# Patient Record
Sex: Female | Born: 1940 | Race: White | Hispanic: No | Smoking: Never smoker
Health system: Southern US, Community
[De-identification: ages and names within clinical notes are randomized; demographics above are authoritative.]

## PROBLEM LIST (undated history)

## (undated) DIAGNOSIS — M542 Cervicalgia: Secondary | ICD-10-CM

## (undated) DIAGNOSIS — M5030 Other cervical disc degeneration, unspecified cervical region: Secondary | ICD-10-CM

## (undated) DIAGNOSIS — R110 Nausea: Secondary | ICD-10-CM

## (undated) DIAGNOSIS — I10 Essential (primary) hypertension: Secondary | ICD-10-CM

## (undated) DIAGNOSIS — G8929 Other chronic pain: Secondary | ICD-10-CM

## (undated) DIAGNOSIS — K219 Gastro-esophageal reflux disease without esophagitis: Secondary | ICD-10-CM

## (undated) DIAGNOSIS — K30 Functional dyspepsia: Secondary | ICD-10-CM

## (undated) DIAGNOSIS — E785 Hyperlipidemia, unspecified: Secondary | ICD-10-CM

## (undated) DIAGNOSIS — M1990 Unspecified osteoarthritis, unspecified site: Secondary | ICD-10-CM

## (undated) DIAGNOSIS — I509 Heart failure, unspecified: Secondary | ICD-10-CM

## (undated) DIAGNOSIS — Z9289 Personal history of other medical treatment: Secondary | ICD-10-CM

## (undated) DIAGNOSIS — R32 Unspecified urinary incontinence: Secondary | ICD-10-CM

## (undated) DIAGNOSIS — M109 Gout, unspecified: Secondary | ICD-10-CM

## (undated) HISTORY — PX: SPINE SURGERY: SHX786

## (undated) HISTORY — PX: BREAST CYST EXCISION: SHX579

## (undated) HISTORY — PX: REPLACEMENT TOTAL KNEE: SUR1224

## (undated) HISTORY — DX: Unspecified urinary incontinence: R32

## (undated) HISTORY — PX: ABDOMINAL HYSTERECTOMY: SHX81

## (undated) HISTORY — DX: Gastro-esophageal reflux disease without esophagitis: K21.9

---

## 2012-04-20 DIAGNOSIS — M545 Low back pain: Secondary | ICD-10-CM | POA: Diagnosis not present

## 2012-04-20 DIAGNOSIS — I10 Essential (primary) hypertension: Secondary | ICD-10-CM | POA: Diagnosis not present

## 2012-04-20 DIAGNOSIS — E559 Vitamin D deficiency, unspecified: Secondary | ICD-10-CM | POA: Diagnosis not present

## 2012-04-20 DIAGNOSIS — Z0000 Encounter for general adult medical examination without abnormal findings: Secondary | ICD-10-CM | POA: Diagnosis not present

## 2012-04-20 DIAGNOSIS — Z23 Encounter for immunization: Secondary | ICD-10-CM | POA: Diagnosis not present

## 2012-04-20 DIAGNOSIS — E785 Hyperlipidemia, unspecified: Secondary | ICD-10-CM | POA: Diagnosis not present

## 2012-04-20 DIAGNOSIS — R279 Unspecified lack of coordination: Secondary | ICD-10-CM | POA: Diagnosis not present

## 2012-05-30 DIAGNOSIS — M1710 Unilateral primary osteoarthritis, unspecified knee: Secondary | ICD-10-CM | POA: Diagnosis not present

## 2012-06-26 DIAGNOSIS — R109 Unspecified abdominal pain: Secondary | ICD-10-CM | POA: Diagnosis not present

## 2012-06-26 DIAGNOSIS — R110 Nausea: Secondary | ICD-10-CM | POA: Diagnosis not present

## 2012-06-26 DIAGNOSIS — K219 Gastro-esophageal reflux disease without esophagitis: Secondary | ICD-10-CM | POA: Diagnosis not present

## 2012-06-26 DIAGNOSIS — I10 Essential (primary) hypertension: Secondary | ICD-10-CM | POA: Diagnosis not present

## 2012-06-26 DIAGNOSIS — Z90710 Acquired absence of both cervix and uterus: Secondary | ICD-10-CM | POA: Diagnosis not present

## 2012-06-26 DIAGNOSIS — N390 Urinary tract infection, site not specified: Secondary | ICD-10-CM | POA: Diagnosis not present

## 2012-07-02 DIAGNOSIS — Z90710 Acquired absence of both cervix and uterus: Secondary | ICD-10-CM | POA: Diagnosis not present

## 2012-07-02 DIAGNOSIS — R110 Nausea: Secondary | ICD-10-CM | POA: Diagnosis not present

## 2012-07-02 DIAGNOSIS — E7800 Pure hypercholesterolemia, unspecified: Secondary | ICD-10-CM | POA: Diagnosis not present

## 2012-07-02 DIAGNOSIS — N390 Urinary tract infection, site not specified: Secondary | ICD-10-CM | POA: Diagnosis not present

## 2012-07-02 DIAGNOSIS — K219 Gastro-esophageal reflux disease without esophagitis: Secondary | ICD-10-CM | POA: Diagnosis not present

## 2012-07-02 DIAGNOSIS — Z862 Personal history of diseases of the blood and blood-forming organs and certain disorders involving the immune mechanism: Secondary | ICD-10-CM | POA: Diagnosis not present

## 2012-07-02 DIAGNOSIS — I10 Essential (primary) hypertension: Secondary | ICD-10-CM | POA: Diagnosis not present

## 2012-07-02 DIAGNOSIS — A048 Other specified bacterial intestinal infections: Secondary | ICD-10-CM | POA: Diagnosis not present

## 2012-07-05 DIAGNOSIS — K2990 Gastroduodenitis, unspecified, without bleeding: Secondary | ICD-10-CM | POA: Diagnosis not present

## 2012-07-05 DIAGNOSIS — R112 Nausea with vomiting, unspecified: Secondary | ICD-10-CM | POA: Diagnosis not present

## 2012-07-05 DIAGNOSIS — K219 Gastro-esophageal reflux disease without esophagitis: Secondary | ICD-10-CM | POA: Diagnosis not present

## 2012-07-05 DIAGNOSIS — E7800 Pure hypercholesterolemia, unspecified: Secondary | ICD-10-CM | POA: Diagnosis not present

## 2012-07-05 DIAGNOSIS — I10 Essential (primary) hypertension: Secondary | ICD-10-CM | POA: Diagnosis not present

## 2012-07-05 DIAGNOSIS — R1013 Epigastric pain: Secondary | ICD-10-CM | POA: Diagnosis not present

## 2012-07-05 DIAGNOSIS — K2960 Other gastritis without bleeding: Secondary | ICD-10-CM | POA: Diagnosis not present

## 2012-07-05 DIAGNOSIS — A048 Other specified bacterial intestinal infections: Secondary | ICD-10-CM | POA: Diagnosis not present

## 2012-07-05 DIAGNOSIS — E860 Dehydration: Secondary | ICD-10-CM | POA: Diagnosis not present

## 2012-07-05 DIAGNOSIS — Z90710 Acquired absence of both cervix and uterus: Secondary | ICD-10-CM | POA: Diagnosis not present

## 2012-07-05 DIAGNOSIS — K2970 Gastritis, unspecified, without bleeding: Secondary | ICD-10-CM | POA: Diagnosis not present

## 2012-07-05 DIAGNOSIS — R109 Unspecified abdominal pain: Secondary | ICD-10-CM | POA: Diagnosis not present

## 2012-07-05 DIAGNOSIS — Z862 Personal history of diseases of the blood and blood-forming organs and certain disorders involving the immune mechanism: Secondary | ICD-10-CM | POA: Diagnosis not present

## 2012-07-05 DIAGNOSIS — I447 Left bundle-branch block, unspecified: Secondary | ICD-10-CM | POA: Diagnosis not present

## 2012-07-05 DIAGNOSIS — R110 Nausea: Secondary | ICD-10-CM | POA: Diagnosis not present

## 2012-07-07 DIAGNOSIS — K2990 Gastroduodenitis, unspecified, without bleeding: Secondary | ICD-10-CM | POA: Diagnosis not present

## 2012-07-07 DIAGNOSIS — K2970 Gastritis, unspecified, without bleeding: Secondary | ICD-10-CM | POA: Diagnosis not present

## 2012-07-07 DIAGNOSIS — K921 Melena: Secondary | ICD-10-CM | POA: Diagnosis not present

## 2012-08-01 DIAGNOSIS — E559 Vitamin D deficiency, unspecified: Secondary | ICD-10-CM | POA: Diagnosis not present

## 2012-08-01 DIAGNOSIS — M25549 Pain in joints of unspecified hand: Secondary | ICD-10-CM | POA: Diagnosis not present

## 2012-08-01 DIAGNOSIS — K2990 Gastroduodenitis, unspecified, without bleeding: Secondary | ICD-10-CM | POA: Diagnosis not present

## 2012-08-01 DIAGNOSIS — M2550 Pain in unspecified joint: Secondary | ICD-10-CM | POA: Diagnosis not present

## 2012-08-01 DIAGNOSIS — I10 Essential (primary) hypertension: Secondary | ICD-10-CM | POA: Diagnosis not present

## 2012-08-01 DIAGNOSIS — E785 Hyperlipidemia, unspecified: Secondary | ICD-10-CM | POA: Diagnosis not present

## 2012-08-01 DIAGNOSIS — K2970 Gastritis, unspecified, without bleeding: Secondary | ICD-10-CM | POA: Diagnosis not present

## 2012-08-27 DIAGNOSIS — M51379 Other intervertebral disc degeneration, lumbosacral region without mention of lumbar back pain or lower extremity pain: Secondary | ICD-10-CM | POA: Diagnosis not present

## 2012-08-27 DIAGNOSIS — M5137 Other intervertebral disc degeneration, lumbosacral region: Secondary | ICD-10-CM | POA: Diagnosis not present

## 2012-08-27 DIAGNOSIS — N390 Urinary tract infection, site not specified: Secondary | ICD-10-CM | POA: Diagnosis not present

## 2012-08-27 DIAGNOSIS — M549 Dorsalgia, unspecified: Secondary | ICD-10-CM | POA: Diagnosis not present

## 2012-08-27 DIAGNOSIS — Z96659 Presence of unspecified artificial knee joint: Secondary | ICD-10-CM | POA: Diagnosis not present

## 2012-09-26 DIAGNOSIS — E2839 Other primary ovarian failure: Secondary | ICD-10-CM | POA: Diagnosis not present

## 2013-06-30 ENCOUNTER — Emergency Department (HOSPITAL_COMMUNITY)
Admission: EM | Admit: 2013-06-30 | Discharge: 2013-06-30 | Disposition: A | Discharge status: Home / Self Care | Payer: Medicare Other | Attending: Emergency Medicine | Admitting: Emergency Medicine

## 2013-06-30 ENCOUNTER — Encounter (HOSPITAL_COMMUNITY): Payer: Self-pay | Admitting: Emergency Medicine

## 2013-06-30 ENCOUNTER — Emergency Department (HOSPITAL_COMMUNITY): Payer: Medicare Other

## 2013-06-30 DIAGNOSIS — R059 Cough, unspecified: Secondary | ICD-10-CM | POA: Diagnosis not present

## 2013-06-30 DIAGNOSIS — R509 Fever, unspecified: Secondary | ICD-10-CM | POA: Diagnosis not present

## 2013-06-30 DIAGNOSIS — Z862 Personal history of diseases of the blood and blood-forming organs and certain disorders involving the immune mechanism: Secondary | ICD-10-CM | POA: Diagnosis not present

## 2013-06-30 DIAGNOSIS — I10 Essential (primary) hypertension: Secondary | ICD-10-CM | POA: Insufficient documentation

## 2013-06-30 DIAGNOSIS — Z8639 Personal history of other endocrine, nutritional and metabolic disease: Secondary | ICD-10-CM | POA: Diagnosis not present

## 2013-06-30 DIAGNOSIS — IMO0001 Reserved for inherently not codable concepts without codable children: Secondary | ICD-10-CM | POA: Insufficient documentation

## 2013-06-30 DIAGNOSIS — R05 Cough: Secondary | ICD-10-CM | POA: Diagnosis not present

## 2013-06-30 DIAGNOSIS — R6889 Other general symptoms and signs: Secondary | ICD-10-CM | POA: Diagnosis not present

## 2013-06-30 DIAGNOSIS — J111 Influenza due to unidentified influenza virus with other respiratory manifestations: Secondary | ICD-10-CM | POA: Diagnosis not present

## 2013-06-30 DIAGNOSIS — E860 Dehydration: Secondary | ICD-10-CM | POA: Diagnosis not present

## 2013-06-30 DIAGNOSIS — J40 Bronchitis, not specified as acute or chronic: Secondary | ICD-10-CM | POA: Diagnosis not present

## 2013-06-30 HISTORY — DX: Gout, unspecified: M10.9

## 2013-06-30 HISTORY — DX: Essential (primary) hypertension: I10

## 2013-06-30 HISTORY — DX: Hyperlipidemia, unspecified: E78.5

## 2013-06-30 LAB — CBC WITH DIFFERENTIAL/PLATELET
BASOS ABS: 0 10*3/uL (ref 0.0–0.1)
BASOS PCT: 0 % (ref 0–1)
Eosinophils Absolute: 0 10*3/uL (ref 0.0–0.7)
Eosinophils Relative: 0 % (ref 0–5)
HCT: 33.9 % — ABNORMAL LOW (ref 36.0–46.0)
Hemoglobin: 11.4 g/dL — ABNORMAL LOW (ref 12.0–15.0)
Lymphocytes Relative: 10 % — ABNORMAL LOW (ref 12–46)
Lymphs Abs: 0.6 10*3/uL — ABNORMAL LOW (ref 0.7–4.0)
MCH: 30.6 pg (ref 26.0–34.0)
MCHC: 33.6 g/dL (ref 30.0–36.0)
MCV: 90.9 fL (ref 78.0–100.0)
Monocytes Absolute: 0.5 10*3/uL (ref 0.1–1.0)
Monocytes Relative: 9 % (ref 3–12)
NEUTROS ABS: 4.5 10*3/uL (ref 1.7–7.7)
NEUTROS PCT: 80 % — AB (ref 43–77)
Platelets: 101 10*3/uL — ABNORMAL LOW (ref 150–400)
RBC: 3.73 MIL/uL — ABNORMAL LOW (ref 3.87–5.11)
RDW: 14.5 % (ref 11.5–15.5)
WBC: 5.6 10*3/uL (ref 4.0–10.5)

## 2013-06-30 LAB — COMPREHENSIVE METABOLIC PANEL
ALBUMIN: 3.3 g/dL — AB (ref 3.5–5.2)
ALK PHOS: 37 U/L — AB (ref 39–117)
ALT: 19 U/L (ref 0–35)
AST: 29 U/L (ref 0–37)
BILIRUBIN TOTAL: 0.4 mg/dL (ref 0.3–1.2)
BUN: 19 mg/dL (ref 6–23)
CHLORIDE: 100 meq/L (ref 96–112)
CO2: 23 mEq/L (ref 19–32)
Calcium: 8.5 mg/dL (ref 8.4–10.5)
Creatinine, Ser: 1.85 mg/dL — ABNORMAL HIGH (ref 0.50–1.10)
GFR calc Af Amer: 30 mL/min — ABNORMAL LOW (ref >90)
GFR calc non Af Amer: 26 mL/min — ABNORMAL LOW (ref >90)
Glucose, Bld: 110 mg/dL — ABNORMAL HIGH (ref 70–99)
POTASSIUM: 3.4 meq/L — AB (ref 3.7–5.3)
SODIUM: 138 meq/L (ref 137–147)
Total Protein: 6.2 g/dL (ref 6.0–8.3)

## 2013-06-30 LAB — INFLUENZA PANEL BY PCR (TYPE A & B)
H1N1 flu by pcr: NOT DETECTED
INFLBPCR: NEGATIVE
Influenza A By PCR: POSITIVE — AB

## 2013-06-30 MED ORDER — SODIUM CHLORIDE 0.9 % IV BOLUS (SEPSIS)
500.0000 mL | Freq: Once | INTRAVENOUS | Status: AC
  Administered 2013-06-30: 500 mL via INTRAVENOUS

## 2013-06-30 MED ORDER — ACETAMINOPHEN 325 MG PO TABS
650.0000 mg | ORAL_TABLET | Freq: Four times a day (QID) | ORAL | Status: DC | PRN
  Administered 2013-06-30: 650 mg via ORAL
  Filled 2013-06-30 (×2): qty 2

## 2013-06-30 NOTE — ED Notes (Signed)
Flu like symptoms times 3 days.  Temp 102.5 per ems temp.
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
## 2013-06-30 NOTE — Discharge Instructions (Signed)
Follow up if not improving.  Plenty of fluids

## 2013-08-23 DIAGNOSIS — R7309 Other abnormal glucose: Secondary | ICD-10-CM | POA: Diagnosis not present

## 2013-08-23 DIAGNOSIS — E785 Hyperlipidemia, unspecified: Secondary | ICD-10-CM | POA: Diagnosis not present

## 2013-08-23 DIAGNOSIS — E559 Vitamin D deficiency, unspecified: Secondary | ICD-10-CM | POA: Diagnosis not present

## 2013-08-23 DIAGNOSIS — I10 Essential (primary) hypertension: Secondary | ICD-10-CM | POA: Diagnosis not present

## 2013-08-23 DIAGNOSIS — M25569 Pain in unspecified knee: Secondary | ICD-10-CM | POA: Diagnosis not present

## 2014-03-07 DIAGNOSIS — Z8249 Family history of ischemic heart disease and other diseases of the circulatory system: Secondary | ICD-10-CM | POA: Diagnosis not present

## 2014-03-07 DIAGNOSIS — Z881 Allergy status to other antibiotic agents status: Secondary | ICD-10-CM | POA: Diagnosis not present

## 2014-03-07 DIAGNOSIS — R079 Chest pain, unspecified: Secondary | ICD-10-CM | POA: Diagnosis not present

## 2014-03-07 DIAGNOSIS — N318 Other neuromuscular dysfunction of bladder: Secondary | ICD-10-CM | POA: Diagnosis not present

## 2014-03-07 DIAGNOSIS — I10 Essential (primary) hypertension: Secondary | ICD-10-CM | POA: Diagnosis not present

## 2014-03-07 DIAGNOSIS — Z79899 Other long term (current) drug therapy: Secondary | ICD-10-CM | POA: Diagnosis not present

## 2014-03-07 DIAGNOSIS — K219 Gastro-esophageal reflux disease without esophagitis: Secondary | ICD-10-CM | POA: Diagnosis not present

## 2014-03-07 DIAGNOSIS — R0789 Other chest pain: Secondary | ICD-10-CM | POA: Diagnosis not present

## 2014-03-07 DIAGNOSIS — R0602 Shortness of breath: Secondary | ICD-10-CM | POA: Diagnosis not present

## 2014-03-07 DIAGNOSIS — E785 Hyperlipidemia, unspecified: Secondary | ICD-10-CM | POA: Diagnosis not present

## 2014-03-08 DIAGNOSIS — R079 Chest pain, unspecified: Secondary | ICD-10-CM | POA: Diagnosis not present

## 2014-03-11 ENCOUNTER — Telehealth: Payer: Self-pay | Admitting: Family Medicine

## 2014-03-11 NOTE — Telephone Encounter (Signed)
mcr and patient wants to transfer from [place]

## 2014-03-11 NOTE — Telephone Encounter (Signed)
appt given for 10/2 with [facility]

## 2014-03-21 DIAGNOSIS — R42 Dizziness and giddiness: Secondary | ICD-10-CM | POA: Diagnosis not present

## 2014-03-21 DIAGNOSIS — N390 Urinary tract infection, site not specified: Secondary | ICD-10-CM | POA: Diagnosis not present

## 2014-03-21 DIAGNOSIS — Z8249 Family history of ischemic heart disease and other diseases of the circulatory system: Secondary | ICD-10-CM | POA: Diagnosis not present

## 2014-03-21 DIAGNOSIS — K219 Gastro-esophageal reflux disease without esophagitis: Secondary | ICD-10-CM | POA: Diagnosis not present

## 2014-03-21 DIAGNOSIS — M436 Torticollis: Secondary | ICD-10-CM | POA: Diagnosis not present

## 2014-03-21 DIAGNOSIS — Z79899 Other long term (current) drug therapy: Secondary | ICD-10-CM | POA: Diagnosis not present

## 2014-03-21 DIAGNOSIS — K30 Functional dyspepsia: Secondary | ICD-10-CM

## 2014-03-21 DIAGNOSIS — Z87891 Personal history of nicotine dependence: Secondary | ICD-10-CM | POA: Diagnosis not present

## 2014-03-21 DIAGNOSIS — R55 Syncope and collapse: Secondary | ICD-10-CM | POA: Diagnosis not present

## 2014-03-21 DIAGNOSIS — E780 Pure hypercholesterolemia: Secondary | ICD-10-CM | POA: Diagnosis not present

## 2014-03-21 DIAGNOSIS — R531 Weakness: Secondary | ICD-10-CM | POA: Diagnosis not present

## 2014-03-21 DIAGNOSIS — I10 Essential (primary) hypertension: Secondary | ICD-10-CM | POA: Diagnosis not present

## 2014-03-21 HISTORY — DX: Functional dyspepsia: K30

## 2014-03-22 ENCOUNTER — Encounter (INDEPENDENT_AMBULATORY_CARE_PROVIDER_SITE_OTHER): Payer: Self-pay

## 2014-03-22 ENCOUNTER — Encounter: Payer: Self-pay | Admitting: Family Medicine

## 2014-03-22 ENCOUNTER — Ambulatory Visit (INDEPENDENT_AMBULATORY_CARE_PROVIDER_SITE_OTHER): Payer: Medicare Other | Admitting: Family Medicine

## 2014-03-22 VITALS — BP 133/66 | HR 63 | Temp 98.1°F | Ht 64.0 in | Wt 151.0 lb

## 2014-03-22 DIAGNOSIS — N184 Chronic kidney disease, stage 4 (severe): Secondary | ICD-10-CM

## 2014-03-22 DIAGNOSIS — R634 Abnormal weight loss: Secondary | ICD-10-CM

## 2014-03-22 DIAGNOSIS — Z23 Encounter for immunization: Secondary | ICD-10-CM | POA: Diagnosis not present

## 2014-03-22 DIAGNOSIS — I10 Essential (primary) hypertension: Secondary | ICD-10-CM

## 2014-03-22 DIAGNOSIS — R1310 Dysphagia, unspecified: Secondary | ICD-10-CM | POA: Diagnosis not present

## 2014-03-22 DIAGNOSIS — Z1211 Encounter for screening for malignant neoplasm of colon: Secondary | ICD-10-CM

## 2014-03-22 NOTE — Progress Notes (Signed)
   Subjective:    Patient ID: Delfin Gant, female    DOB: [birth year], 73 y.o.   MRN: [id number]  HPI  There are no active problems to display for this patient.  Outpatient Encounter Prescriptions as of 03/22/2014  Medication Sig  . allopurinol (ZYLOPRIM) 100 MG tablet Take 100 mg by mouth daily.  Marland Kitchen aspirin EC 81 MG tablet Take 81 mg by mouth daily.  Marland Kitchen esomeprazole (NEXIUM) 40 MG capsule Take 40 mg by mouth daily at 12 noon.  Marland Kitchen lisinopril-hydrochlorothiazide (PRINZIDE,ZESTORETIC) 10-12.5 MG per tablet Take 1 tablet by mouth daily.  . Multiple Vitamin (MULTIVITAMIN WITH MINERALS) TABS tablet Take 1 tablet by mouth daily.  . ondansetron (ZOFRAN) 4 MG tablet Take 1 tablet by mouth every 6 (six) hours as needed.  Marland Kitchen oxybutynin (DITROPAN) 5 MG tablet Take 1 tablet by mouth 2 (two) times daily as needed.  . [DISCONTINUED] atorvastatin (LIPITOR) 10 MG tablet Take 10 mg by mouth daily.  . [DISCONTINUED] atenolol (TENORMIN) 50 MG tablet Take 50 mg by mouth daily.  . [DISCONTINUED] diphenhydramine-acetaminophen (TYLENOL PM) 25-500 MG TABS Take 1 tablet by mouth at bedtime as needed (sleep).  . [DISCONTINUED] doxazosin (CARDURA) 4 MG tablet Take 4 mg by mouth daily.  . [DISCONTINUED] fenofibrate (TRICOR) 145 MG tablet Take 145 mg by mouth daily.  . [DISCONTINUED] hydrochlorothiazide (HYDRODIURIL) 25 MG tablet Take 25 mg by mouth daily.  . [DISCONTINUED] potassium chloride SA (K-DUR,KLOR-CON) 20 MEQ tablet Take 20 mEq by mouth once.  . [DISCONTINUED] promethazine (PHENERGAN) 12.5 MG tablet Take 12.5 mg by mouth daily as needed for nausea or vomiting.  . [DISCONTINUED] tolterodine (DETROL LA) 4 MG 24 hr capsule Take 4 mg by mouth daily.     Review of Systems     Objective:   Physical Exam BP 133/66  Pulse 63  Temp(Src) 98.1 F (36.7 C) (Oral)  Ht 5\' 4"  (1.626 m)  Wt 151 lb (68.493 kg)  BMI 25.91 kg/m2        Assessment & Plan:

## 2014-03-22 NOTE — Progress Notes (Signed)
   Subjective:    Patient ID: Melissa Garrett, female    DOB: 20-Jun-1941, 73 y.o.   MRN: [id number]  HPI  73 year old female. This is her first visit here having relocated from near [facility]. She was recently hospitalized in [place] for chest pain. She had negative stress test and was told her chest pain was related to acid reflux. Since she has been taking Nexium she has not had more chest pain. She does have anorexia and weight loss and has been suggested to take boost to supplement her diet. She went to the emergency room yesterday and her blood pressure medicine was changed from atenolol to lisinopril with hydrochlorothiazide. Blood pressure yesterday was 181 today is 133/66. Her potassium was also slightly low at 3.2. She was given instructions to followup with blood work to check renal function and potassium. As I reviewed her labs from the hospital her estimated GFR is 40    Review of Systems  Constitutional: Positive for unexpected weight change.  HENT: Negative.   Respiratory: Negative.   Cardiovascular: Negative.   Endocrine: Negative.   Genitourinary: Negative.   Neurological: Negative.   Hematological: Negative.   Psychiatric/Behavioral: Negative.        Objective:   Physical Exam  Constitutional: She is oriented to person, place, and time.  Gets about in [facility] (had knee replacement x 2 same knee)  HENT:  Head: Normocephalic.  Eyes: Pupils are equal, round, and reactive to light.  Cardiovascular: Normal rate.   Pulmonary/Chest: Effort normal.  Neurological: She is alert and oriented to person, place, and time. She has normal reflexes.  Psychiatric: She has a normal mood and affect.    BP 133/66  Pulse 63  Temp(Src) 98.1 F (36.7 C) (Oral)  Ht 5\' 4"  (1.626 m)  Wt 151 lb (68.493 kg)  BMI 25.91 kg/m2      Assessment & Plan:  1. Dysphagia  - Ambulatory referral to Gastroenterology  2. Encounter for screening colonoscopy  - Ambulatory referral to  Gastroenterology 1. Dysphagia  - Ambulatory referral to Gastroenterology  2. Encounter for screening colonoscopy  - Ambulatory referral to Gastroenterology  3. Essential hypertension Lisinopril/HCTZ just started  4. Loss of weight Supplementing diet with BOOST  5. Chronic kidney disease, stage 4 (severe) Will repeat test in 3 weeks, suggesting more liquids in meantime  Wardell Honour MD

## 2014-03-26 ENCOUNTER — Telehealth: Payer: Self-pay | Admitting: Family Medicine

## 2014-03-26 DIAGNOSIS — K219 Gastro-esophageal reflux disease without esophagitis: Secondary | ICD-10-CM | POA: Diagnosis not present

## 2014-03-26 DIAGNOSIS — R197 Diarrhea, unspecified: Secondary | ICD-10-CM | POA: Diagnosis not present

## 2014-03-26 DIAGNOSIS — I10 Essential (primary) hypertension: Secondary | ICD-10-CM | POA: Diagnosis not present

## 2014-03-26 DIAGNOSIS — Z8249 Family history of ischemic heart disease and other diseases of the circulatory system: Secondary | ICD-10-CM | POA: Diagnosis not present

## 2014-03-26 DIAGNOSIS — Z79899 Other long term (current) drug therapy: Secondary | ICD-10-CM | POA: Diagnosis not present

## 2014-03-26 DIAGNOSIS — R110 Nausea: Secondary | ICD-10-CM | POA: Diagnosis not present

## 2014-03-26 DIAGNOSIS — E780 Pure hypercholesterolemia: Secondary | ICD-10-CM | POA: Diagnosis not present

## 2014-03-26 NOTE — Telephone Encounter (Signed)
Patient states when she has a bowel movement it is black. Patient advised that usually if it is black that it usually means blood and is advised to go to ER for evaluation.

## 2014-03-27 ENCOUNTER — Emergency Department (HOSPITAL_COMMUNITY): Payer: Medicare Other

## 2014-03-27 ENCOUNTER — Inpatient Hospital Stay (HOSPITAL_COMMUNITY)
Admission: EM | Admit: 2014-03-27 | Discharge: 2014-03-29 | DRG: 392 | Disposition: A | Discharge status: Home / Self Care | Payer: Medicare Other | Attending: Internal Medicine | Admitting: Internal Medicine

## 2014-03-27 ENCOUNTER — Encounter (HOSPITAL_COMMUNITY): Payer: Self-pay | Admitting: Emergency Medicine

## 2014-03-27 DIAGNOSIS — R748 Abnormal levels of other serum enzymes: Secondary | ICD-10-CM | POA: Diagnosis present

## 2014-03-27 DIAGNOSIS — Z7982 Long term (current) use of aspirin: Secondary | ICD-10-CM | POA: Diagnosis not present

## 2014-03-27 DIAGNOSIS — N183 Chronic kidney disease, stage 3 unspecified: Secondary | ICD-10-CM | POA: Diagnosis present

## 2014-03-27 DIAGNOSIS — R112 Nausea with vomiting, unspecified: Secondary | ICD-10-CM | POA: Diagnosis not present

## 2014-03-27 DIAGNOSIS — D131 Benign neoplasm of stomach: Secondary | ICD-10-CM | POA: Diagnosis not present

## 2014-03-27 DIAGNOSIS — I708 Atherosclerosis of other arteries: Secondary | ICD-10-CM | POA: Diagnosis present

## 2014-03-27 DIAGNOSIS — N179 Acute kidney failure, unspecified: Secondary | ICD-10-CM | POA: Diagnosis present

## 2014-03-27 DIAGNOSIS — K5900 Constipation, unspecified: Secondary | ICD-10-CM | POA: Diagnosis present

## 2014-03-27 DIAGNOSIS — K219 Gastro-esophageal reflux disease without esophagitis: Secondary | ICD-10-CM | POA: Diagnosis present

## 2014-03-27 DIAGNOSIS — I129 Hypertensive chronic kidney disease with stage 1 through stage 4 chronic kidney disease, or unspecified chronic kidney disease: Secondary | ICD-10-CM | POA: Diagnosis present

## 2014-03-27 DIAGNOSIS — K317 Polyp of stomach and duodenum: Secondary | ICD-10-CM | POA: Diagnosis present

## 2014-03-27 DIAGNOSIS — R634 Abnormal weight loss: Secondary | ICD-10-CM | POA: Diagnosis not present

## 2014-03-27 DIAGNOSIS — N189 Chronic kidney disease, unspecified: Secondary | ICD-10-CM

## 2014-03-27 DIAGNOSIS — K8020 Calculus of gallbladder without cholecystitis without obstruction: Secondary | ICD-10-CM | POA: Diagnosis not present

## 2014-03-27 DIAGNOSIS — Z90710 Acquired absence of both cervix and uterus: Secondary | ICD-10-CM | POA: Diagnosis not present

## 2014-03-27 DIAGNOSIS — R197 Diarrhea, unspecified: Secondary | ICD-10-CM | POA: Diagnosis present

## 2014-03-27 DIAGNOSIS — Z79899 Other long term (current) drug therapy: Secondary | ICD-10-CM | POA: Diagnosis not present

## 2014-03-27 DIAGNOSIS — D649 Anemia, unspecified: Secondary | ICD-10-CM | POA: Diagnosis present

## 2014-03-27 DIAGNOSIS — E785 Hyperlipidemia, unspecified: Secondary | ICD-10-CM | POA: Diagnosis present

## 2014-03-27 DIAGNOSIS — Z825 Family history of asthma and other chronic lower respiratory diseases: Secondary | ICD-10-CM | POA: Diagnosis not present

## 2014-03-27 DIAGNOSIS — E860 Dehydration: Secondary | ICD-10-CM | POA: Diagnosis present

## 2014-03-27 DIAGNOSIS — R110 Nausea: Secondary | ICD-10-CM

## 2014-03-27 DIAGNOSIS — K3184 Gastroparesis: Principal | ICD-10-CM

## 2014-03-27 DIAGNOSIS — R630 Anorexia: Secondary | ICD-10-CM | POA: Diagnosis not present

## 2014-03-27 DIAGNOSIS — D3001 Benign neoplasm of right kidney: Secondary | ICD-10-CM | POA: Diagnosis not present

## 2014-03-27 DIAGNOSIS — Z8052 Family history of malignant neoplasm of bladder: Secondary | ICD-10-CM

## 2014-03-27 DIAGNOSIS — T370X5A Adverse effect of sulfonamides, initial encounter: Secondary | ICD-10-CM | POA: Diagnosis present

## 2014-03-27 DIAGNOSIS — I10 Essential (primary) hypertension: Secondary | ICD-10-CM | POA: Diagnosis present

## 2014-03-27 DIAGNOSIS — I739 Peripheral vascular disease, unspecified: Secondary | ICD-10-CM | POA: Diagnosis present

## 2014-03-27 DIAGNOSIS — I714 Abdominal aortic aneurysm, without rupture: Secondary | ICD-10-CM | POA: Diagnosis not present

## 2014-03-27 DIAGNOSIS — I771 Stricture of artery: Secondary | ICD-10-CM | POA: Diagnosis present

## 2014-03-27 LAB — URINALYSIS, ROUTINE W REFLEX MICROSCOPIC
BILIRUBIN URINE: NEGATIVE
Glucose, UA: NEGATIVE mg/dL
KETONES UR: NEGATIVE mg/dL
Nitrite: NEGATIVE
Protein, ur: NEGATIVE mg/dL
Specific Gravity, Urine: 1.01 (ref 1.005–1.030)
UROBILINOGEN UA: 0.2 mg/dL (ref 0.0–1.0)
pH: 6 (ref 5.0–8.0)

## 2014-03-27 LAB — CBC WITH DIFFERENTIAL/PLATELET
Basophils Absolute: 0 10*3/uL (ref 0.0–0.1)
Basophils Relative: 0 % (ref 0–1)
Eosinophils Absolute: 0.2 10*3/uL (ref 0.0–0.7)
Eosinophils Relative: 2 % (ref 0–5)
HCT: 32.4 % — ABNORMAL LOW (ref 36.0–46.0)
HEMOGLOBIN: 11.3 g/dL — AB (ref 12.0–15.0)
LYMPHS ABS: 2.8 10*3/uL (ref 0.7–4.0)
Lymphocytes Relative: 31 % (ref 12–46)
MCH: 31.4 pg (ref 26.0–34.0)
MCHC: 34.9 g/dL (ref 30.0–36.0)
MCV: 90 fL (ref 78.0–100.0)
MONOS PCT: 6 % (ref 3–12)
Monocytes Absolute: 0.5 10*3/uL (ref 0.1–1.0)
NEUTROS PCT: 62 % (ref 43–77)
Neutro Abs: 5.6 10*3/uL (ref 1.7–7.7)
Platelets: 218 10*3/uL (ref 150–400)
RBC: 3.6 MIL/uL — ABNORMAL LOW (ref 3.87–5.11)
RDW: 14.2 % (ref 11.5–15.5)
WBC: 9.2 10*3/uL (ref 4.0–10.5)

## 2014-03-27 LAB — COMPREHENSIVE METABOLIC PANEL
ALK PHOS: 36 U/L — AB (ref 39–117)
ALT: 18 U/L (ref 0–35)
ANION GAP: 15 (ref 5–15)
AST: 29 U/L (ref 0–37)
Albumin: 3.6 g/dL (ref 3.5–5.2)
BUN: 25 mg/dL — AB (ref 6–23)
CO2: 21 mEq/L (ref 19–32)
Calcium: 9.5 mg/dL (ref 8.4–10.5)
Chloride: 97 mEq/L (ref 96–112)
Creatinine, Ser: 2.54 mg/dL — ABNORMAL HIGH (ref 0.50–1.10)
GFR, EST AFRICAN AMERICAN: 20 mL/min — AB (ref >90)
GFR, EST NON AFRICAN AMERICAN: 18 mL/min — AB (ref >90)
GLUCOSE: 103 mg/dL — AB (ref 70–99)
Potassium: 4.7 mEq/L (ref 3.7–5.3)
Sodium: 133 mEq/L — ABNORMAL LOW (ref 137–147)
TOTAL PROTEIN: 6.6 g/dL (ref 6.0–8.3)
Total Bilirubin: 0.3 mg/dL (ref 0.3–1.2)

## 2014-03-27 LAB — PROTIME-INR
INR: 1.02 (ref 0.00–1.49)
Prothrombin Time: 13.4 seconds (ref 11.6–15.2)

## 2014-03-27 LAB — APTT: APTT: 28 s (ref 24–37)

## 2014-03-27 LAB — URINE MICROSCOPIC-ADD ON

## 2014-03-27 LAB — LIPASE, BLOOD: Lipase: 60 U/L — ABNORMAL HIGH (ref 11–59)

## 2014-03-27 MED ORDER — SODIUM CHLORIDE 0.9 % IV SOLN
INTRAVENOUS | Status: AC
  Administered 2014-03-28: via INTRAVENOUS

## 2014-03-27 MED ORDER — ACETAMINOPHEN 650 MG RE SUPP: 650.0000 mg | Freq: Four times a day (QID) | RECTAL | Status: DC | PRN

## 2014-03-27 MED ORDER — OXYBUTYNIN CHLORIDE 5 MG PO TABS
5.0000 mg | ORAL_TABLET | Freq: Every day | ORAL | Status: DC
  Administered 2014-03-28 – 2014-03-29 (×2): 5 mg via ORAL
  Filled 2014-03-27 (×2): qty 1

## 2014-03-27 MED ORDER — HYDRALAZINE HCL 20 MG/ML IJ SOLN: 10.0000 mg | Freq: Four times a day (QID) | INTRAMUSCULAR | Status: DC | PRN

## 2014-03-27 MED ORDER — ONDANSETRON HCL 4 MG PO TABS: 4.0000 mg | ORAL_TABLET | Freq: Four times a day (QID) | ORAL | Status: DC | PRN

## 2014-03-27 MED ORDER — ASPIRIN EC 81 MG PO TBEC
81.0000 mg | DELAYED_RELEASE_TABLET | Freq: Every day | ORAL | Status: DC
  Administered 2014-03-28 – 2014-03-29 (×2): 81 mg via ORAL
  Filled 2014-03-27 (×2): qty 1

## 2014-03-27 MED ORDER — PANTOPRAZOLE SODIUM 40 MG IV SOLR
40.0000 mg | Freq: Once | INTRAVENOUS | Status: AC
  Administered 2014-03-27: 40 mg via INTRAVENOUS
  Filled 2014-03-27: qty 40

## 2014-03-27 MED ORDER — SODIUM CHLORIDE 0.9 % IV BOLUS (SEPSIS)
500.0000 mL | Freq: Once | INTRAVENOUS | Status: AC
  Administered 2014-03-27: 500 mL via INTRAVENOUS

## 2014-03-27 MED ORDER — ONDANSETRON HCL 4 MG/2ML IJ SOLN
4.0000 mg | Freq: Four times a day (QID) | INTRAMUSCULAR | Status: DC | PRN
Start: 2014-03-27 — End: 2014-03-29
  Administered 2014-03-28 (×2): 4 mg via INTRAVENOUS
  Filled 2014-03-27 (×2): qty 2

## 2014-03-27 MED ORDER — ALBUTEROL SULFATE (2.5 MG/3ML) 0.083% IN NEBU: 2.5000 mg | INHALATION_SOLUTION | RESPIRATORY_TRACT | Status: DC | PRN

## 2014-03-27 MED ORDER — PANTOPRAZOLE SODIUM 40 MG IV SOLR
40.0000 mg | Freq: Two times a day (BID) | INTRAVENOUS | Status: DC
  Administered 2014-03-28 – 2014-03-29 (×3): 40 mg via INTRAVENOUS
  Filled 2014-03-27 (×3): qty 40

## 2014-03-27 MED ORDER — MORPHINE SULFATE 2 MG/ML IJ SOLN: 0.5000 mg | INTRAMUSCULAR | Status: DC | PRN

## 2014-03-27 MED ORDER — ALLOPURINOL 100 MG PO TABS
100.0000 mg | ORAL_TABLET | Freq: Every day | ORAL | Status: DC
  Administered 2014-03-28 – 2014-03-29 (×2): 100 mg via ORAL
  Filled 2014-03-27 (×2): qty 1

## 2014-03-27 MED ORDER — ACETAMINOPHEN 325 MG PO TABS: 650.0000 mg | ORAL_TABLET | Freq: Four times a day (QID) | ORAL | Status: DC | PRN

## 2014-03-27 MED ORDER — ADULT MULTIVITAMIN W/MINERALS CH
1.0000 | ORAL_TABLET | Freq: Every day | ORAL | Status: DC
  Administered 2014-03-28 – 2014-03-29 (×2): 1 via ORAL
  Filled 2014-03-27 (×2): qty 1

## 2014-03-27 MED ORDER — ONDANSETRON HCL 4 MG/2ML IJ SOLN
4.0000 mg | Freq: Once | INTRAMUSCULAR | Status: AC
  Administered 2014-03-27: 4 mg via INTRAVENOUS
  Filled 2014-03-27: qty 2
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
## 2014-03-27 NOTE — ED Notes (Signed)
Pt states she has been nauseated x 5 month, tonight worse vomiting, with dry heaves. Pt has lost 50 lbs in less than 6 months.
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
## 2014-03-27 NOTE — ED Notes (Signed)
Dr Zammit at bedside. 

## 2014-03-27 NOTE — ED Notes (Signed)
Hospitalist at bedside 

## 2014-03-28 ENCOUNTER — Encounter (HOSPITAL_COMMUNITY)
Admission: EM | Disposition: A | Discharge status: Home / Self Care | Payer: Self-pay | Source: Home / Self Care | Attending: Internal Medicine

## 2014-03-28 ENCOUNTER — Encounter (HOSPITAL_COMMUNITY): Payer: Self-pay | Admitting: *Deleted

## 2014-03-28 ENCOUNTER — Inpatient Hospital Stay (HOSPITAL_COMMUNITY): Payer: Medicare Other

## 2014-03-28 DIAGNOSIS — R110 Nausea: Secondary | ICD-10-CM

## 2014-03-28 DIAGNOSIS — D131 Benign neoplasm of stomach: Secondary | ICD-10-CM

## 2014-03-28 DIAGNOSIS — R634 Abnormal weight loss: Secondary | ICD-10-CM

## 2014-03-28 DIAGNOSIS — I771 Stricture of artery: Secondary | ICD-10-CM

## 2014-03-28 HISTORY — PX: ESOPHAGOGASTRODUODENOSCOPY: SHX5428

## 2014-03-28 LAB — CBC
HEMATOCRIT: 31.7 % — AB (ref 36.0–46.0)
Hemoglobin: 10.8 g/dL — ABNORMAL LOW (ref 12.0–15.0)
MCH: 30.9 pg (ref 26.0–34.0)
MCHC: 34.1 g/dL (ref 30.0–36.0)
MCV: 90.8 fL (ref 78.0–100.0)
PLATELETS: 191 10*3/uL (ref 150–400)
RBC: 3.49 MIL/uL — AB (ref 3.87–5.11)
RDW: 14.2 % (ref 11.5–15.5)
WBC: 7.7 10*3/uL (ref 4.0–10.5)

## 2014-03-28 LAB — COMPREHENSIVE METABOLIC PANEL
ALBUMIN: 3.2 g/dL — AB (ref 3.5–5.2)
ALT: 14 U/L (ref 0–35)
ANION GAP: 10 (ref 5–15)
AST: 24 U/L (ref 0–37)
Alkaline Phosphatase: 33 U/L — ABNORMAL LOW (ref 39–117)
BILIRUBIN TOTAL: 0.3 mg/dL (ref 0.3–1.2)
BUN: 23 mg/dL (ref 6–23)
CALCIUM: 9.1 mg/dL (ref 8.4–10.5)
CO2: 23 mEq/L (ref 19–32)
CREATININE: 2.27 mg/dL — AB (ref 0.50–1.10)
Chloride: 105 mEq/L (ref 96–112)
GFR calc Af Amer: 23 mL/min — ABNORMAL LOW (ref >90)
GFR calc non Af Amer: 20 mL/min — ABNORMAL LOW (ref >90)
Glucose, Bld: 81 mg/dL (ref 70–99)
Potassium: 5 mEq/L (ref 3.7–5.3)
Sodium: 138 mEq/L (ref 137–147)
Total Protein: 6.1 g/dL (ref 6.0–8.3)

## 2014-03-28 LAB — GI PATHOGEN PANEL BY PCR, STOOL
C DIFFICILE TOXIN A/B: NEGATIVE
Campylobacter by PCR: NEGATIVE
Cryptosporidium by PCR: NEGATIVE
E COLI (STEC): NEGATIVE
E coli (ETEC) LT/ST: NEGATIVE
E coli 0157 by PCR: NEGATIVE
G lamblia by PCR: NEGATIVE
Norovirus GI/GII: NEGATIVE
Rotavirus A by PCR: NEGATIVE
Salmonella by PCR: NEGATIVE
Shigella by PCR: NEGATIVE

## 2014-03-28 LAB — LIPASE, BLOOD: Lipase: 57 U/L (ref 11–59)

## 2014-03-28 LAB — LIPID PANEL
CHOL/HDL RATIO: 5.6 ratio
CHOLESTEROL: 196 mg/dL (ref 0–200)
HDL: 35 mg/dL — AB (ref >39)
LDL CALC: 123 mg/dL — AB (ref 0–99)
TRIGLYCERIDES: 191 mg/dL — AB (ref <150)
VLDL: 38 mg/dL (ref 0–40)

## 2014-03-28 LAB — CLOSTRIDIUM DIFFICILE BY PCR: Toxigenic C. Difficile by PCR: NEGATIVE

## 2014-03-28 LAB — TROPONIN I: Troponin I: <0.3 ng/mL (ref <0.30)

## 2014-03-28 SURGERY — EGD (ESOPHAGOGASTRODUODENOSCOPY)
Anesthesia: Moderate Sedation

## 2014-03-28 MED ORDER — SODIUM CHLORIDE 0.9 % IV SOLN
INTRAVENOUS | Status: DC
  Administered 2014-03-28: 14:00:00 via INTRAVENOUS

## 2014-03-28 MED ORDER — LOPERAMIDE HCL 2 MG PO CAPS
2.0000 mg | ORAL_CAPSULE | Freq: Two times a day (BID) | ORAL | Status: DC | PRN
  Administered 2014-03-28: 2 mg via ORAL
  Filled 2014-03-28: qty 1

## 2014-03-28 MED ORDER — MIDAZOLAM HCL 5 MG/5ML IJ SOLN
INTRAMUSCULAR | Status: AC
  Filled 2014-03-28: qty 10

## 2014-03-28 MED ORDER — MEPERIDINE HCL 50 MG/ML IJ SOLN
INTRAMUSCULAR | Status: AC
  Filled 2014-03-28: qty 1

## 2014-03-28 MED ORDER — MIDAZOLAM HCL 5 MG/5ML IJ SOLN
INTRAMUSCULAR | Status: DC | PRN
  Administered 2014-03-28: 1 mg via INTRAVENOUS
  Administered 2014-03-28 (×2): 2 mg via INTRAVENOUS
  Administered 2014-03-28: 1 mg via INTRAVENOUS

## 2014-03-28 MED ORDER — BUTAMBEN-TETRACAINE-BENZOCAINE 2-2-14 % EX AERO
INHALATION_SPRAY | CUTANEOUS | Status: DC | PRN
Start: 2014-03-28 — End: 2014-03-28
  Administered 2014-03-28: 2 via TOPICAL

## 2014-03-28 MED ORDER — MEPERIDINE HCL 50 MG/ML IJ SOLN
INTRAMUSCULAR | Status: DC | PRN
  Administered 2014-03-28 (×2): 25 mg via INTRAVENOUS

## 2014-03-28 NOTE — Progress Notes (Signed)
Received report from Penryn in ENDO. Patient arrived back to room. Patient was drowsy but had no complaints. Daughter was at the bedside. Bed was at the lowest level, call bell was within reach, and phone was on bedside table. Will continue to monitor patient at this time.

## 2014-03-28 NOTE — Progress Notes (Signed)
[facility]                                                                              Patient Demographics  Melissa Garrett, is a 73 y.o. female, DOB - 1941-01-12, JSE:[id number]  Admit date - 03/27/2014   Admitting Physician Bonnielee Haff, MD  Outpatient Primary MD for the patient is MILLER, Lillette Boxer, MD  LOS - 1   Chief Complaint  Patient presents with  . Nausea  . Abdominal Pain      HPI on 03/27/2014 Melissa Garrett is a 73 y.o. female with a past medical history of essential hypertension, GERD, history of H. pylori in the past, history of esophageal stricture, status post agitation about 12-13 years ago, chronic kidney disease, stage III, who has had nausea for the last many months. As a result of, which she has had very poor oral intake. She has lost about 50 pounds in the last 6 months. She has had periods of vomiting as well, but none recently. Few days ago, she went to see her primary care physician and was diagnosed with a UTI and was started on Bactrim. Three days ago she started having loose stools, and must have at least 7-8 loose bowel movements. Denied any blood in the stool. She went to [facility] yesterday with similar complaints and they did a Hemoccult testing, which was negative. She denied any vaginal discharge. No urinary discomfort. Has not ever had a colonoscopy. Denied any dizziness, lightheadedness. About a month ago her medications for hypertension were changed over from atenolol to lisinopril. This was done due to bradycardia. She denied any abdominal pain. No headaches. No shortness of breath or chest pains.   Assessment & Plan   Chronic nausea with weight loss -Gastroenterology has been consulted and appreciated -Continue PPI -Patient has been changed to a clear liquid diet by gastroenterology -Will likely need endoscopy and outpatient colonoscopy -Continue antiemetics as needed  Acute diarrhea -Likely secondary to antibiotics, Bactrim -C.  difficile PCR pending -Abdominal exam is benign -Pending abdominal ultrasound  Acute on chronic kidney disease, stage III -Likely secondary to dehydration from diarrhea versus lisinopril -Continue IV fluids and monitor BMP  Abnormal appearing gallbladder on CT -May also be contributing to her nausea although abdominal exam is benign -Pending abdominal ultrasound  Elevated lipase -Likely secondary to nausea -Will continue to monitor  Peripheral drill disease in the abdomen, incidental finding -Patient does have good femoral pulses, appears to be asymptomatic at this time -Will need to follow up with her PCP and/or vascular physician as an outpatient  Essential hypertension -Due to her acute kidney injury, lisinopril will be held -Will add on medications if necessary -Patient was recently switched to lisinopril due to to bradycardia on a beta blocker.  History of dysphagia with esophageal dilatation -Gastroenterology consulted and appreciated  Code Status: Full   Family Communication: None bedside   Disposition Plan: Admitted   Time Spent in minutes   30  minutes  Procedures  None   Consults   Gastroenterology   DVT Prophylaxis  SCDs   Lab Results  Component Value Date   PLT 191 03/28/2014    Medications  Scheduled  Meds: . allopurinol  100 mg Oral Daily  . aspirin EC  81 mg Oral Daily  . multivitamin with minerals  1 tablet Oral Daily  . oxybutynin  5 mg Oral Daily  . pantoprazole (PROTONIX) IV  40 mg Intravenous Q12H   Continuous Infusions: . sodium chloride 75 mL/hr at 03/28/14 0000   PRN Meds:.acetaminophen, acetaminophen, albuterol, hydrALAZINE, morphine injection, ondansetron (ZOFRAN) IV, ondansetron  Antibiotics    Anti-infectives   None      Subjective:   Melissa Garrett seen and examined today.  Patient states that she would like to try eat. She states she's not had much of an appetite the last few months. She also states she feels that food  gets stuck in her throat were scratches her esophagus as she swallows. Patient does state she's had to have dilatation of her esophagus approximately 12 years ago. Patient denies any further episodes of diarrhea at this time. She does state she had 5 loose bowel movements yesterday. At this time, denies any shortness of breath, chest pain, dizziness, headache.   Objective:   Filed Vitals:   03/27/14 2300 03/27/14 2327 03/28/14 0340 03/28/14 0450  BP: 153/49 154/68  152/45  Pulse: 54 58  61  Temp:  98 F (36.7 C)  98.5 F (36.9 C)  TempSrc:  Oral    Resp:  18  18  Height:  5\' 4"  (1.626 m)    Weight:   68.13 kg (150 lb 3.2 oz)   SpO2: 95% 100%  99%    Wt Readings from Last 3 Encounters:  03/28/14 68.13 kg (150 lb 3.2 oz)  03/22/14 68.493 kg (151 lb)     Intake/Output Summary (Last 24 hours) at 03/28/14 1032 Last data filed at 03/28/14 0912  Gross per 24 hour  Intake    450 ml  Output   1000 ml  Net   -550 ml    Exam  General: Well developed, well nourished, NAD, appears stated age  HEENT: NCAT, PERRLA, EOMI, Anicteic Sclera, mucous membranes moist.   Cardiovascular: S1 S2 auscultated, no rubs, murmurs or gallops. Regular rate and rhythm.  Respiratory: Clear to auscultation bilaterally with equal chest rise  Abdomen: Soft, nontender, nondistended, + bowel sounds  Extremities: warm dry without cyanosis clubbing or edema  Neuro: AAOx3, cranial nerves grossly intact. Strength equal and bilateral in upper/lower ext  Psych: Normal affect and demeanor with intact judgement and insight  Data Review   Micro Results Recent Results (from the past 240 hour(s))  CLOSTRIDIUM DIFFICILE BY PCR     Status: None   Collection Time    03/28/14  5:07 AM      Result Value Ref Range Status   C difficile by pcr NEGATIVE  NEGATIVE Final    Radiology Reports Ct Abdomen Pelvis Wo Contrast  03/27/2014   CLINICAL DATA:  Chronic nausea for 4-5 months. Has lost 50 lb in 6 months, due to  loss of appetite. Initial encounter.  EXAM: CT ABDOMEN AND PELVIS WITHOUT CONTRAST  TECHNIQUE: Multidetector CT imaging of the abdomen and pelvis was performed following the standard protocol without IV contrast.  COMPARISON:  None.  FINDINGS: The visualized lung bases are clear.  A small calcified granuloma is noted in the periphery of the right hepatic lobe. The liver is otherwise unremarkable. A small stone is noted within the gallbladder, with likely a small amount of higher attenuation sludge. The gallbladder is otherwise unremarkable. The pancreas and adrenal glands are unremarkable.  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 Nonspecific perinephric stranding is noted bilaterally. The kidneys are otherwise unremarkable. There is no evidence of hydronephrosis. No renal or ureteral stones are seen.  No free fluid is identified. The small bowel is unremarkable in appearance. The stomach is within normal limits. No acute vascular abnormalities are seen. Diffuse calcification is seen along the abdominal aorta, with likely severe narrowing of the proximal right common iliac artery, and mild to moderate narrowing of the left common iliac artery.  The appendix is normal in caliber and contains trace air, without evidence for appendicitis. The colon is partially filled with air and fluid. The sigmoid colon is mildly redundant. The colon is unremarkable in appearance.  The bladder is relatively decompressed and not well assessed. The patient is status post hysterectomy. No suspicious adnexal masses are seen. No inguinal lymphadenopathy is seen.  No acute osseous abnormalities are identified. Mild diffuse sclerotic change involving the visualized osseous structures may reflect an underlying systemic condition.  IMPRESSION: 1. No acute abnormality seen in the abdomen or pelvis. 2. Diffuse mild osteosclerosis noted, which may reflect a variety of systemic conditions.  Further evaluation is suggested as deemed clinically appropriate. 3. Small stone within the gallbladder, with a likely small amount of sludge. Gallbladder otherwise unremarkable in appearance. 4. 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 5. Diffuse calcification along the abdominal aorta, with likely severe narrowing of the proximal right common iliac artery, and mild to moderate narrowing of the left common iliac artery.   Electronically Signed   By: Garald Balding M.D.   On: 03/27/2014 21:56    CBC  Recent Labs Lab 03/27/14 1945 03/28/14 0617  WBC 9.2 7.7  HGB 11.3* 10.8*  HCT 32.4* 31.7*  PLT 218 191  MCV 90.0 90.8  MCH 31.4 30.9  MCHC 34.9 34.1  RDW 14.2 14.2  LYMPHSABS 2.8  --   MONOABS 0.5  --   EOSABS 0.2  --   BASOSABS 0.0  --     Chemistries   Recent Labs Lab 03/27/14 1945 03/28/14 0617  NA 133* 138  K 4.7 5.0  CL 97 105  CO2 21 23  GLUCOSE 103* 81  BUN 25* 23  CREATININE 2.54* 2.27*  CALCIUM 9.5 9.1  AST 29 24  ALT 18 14  ALKPHOS 36* 33*  BILITOT 0.3 0.3   ------------------------------------------------------------------------------------------------------------------ estimated creatinine clearance is 20.6 ml/min (by C-G formula based on Cr of 2.27). ------------------------------------------------------------------------------------------------------------------ No results found for this basename: HGBA1C,  in the last 72 hours ------------------------------------------------------------------------------------------------------------------  Recent Labs  03/28/14 0617  CHOL 196  HDL 35*  LDLCALC 123*  TRIG 191*  CHOLHDL 5.6   ------------------------------------------------------------------------------------------------------------------ No results found for this basename: TSH, T4TOTAL, FREET3, T3FREE, THYROIDAB,  in the last 72  hours ------------------------------------------------------------------------------------------------------------------ No results found for this basename: VITAMINB12, FOLATE, FERRITIN, TIBC, IRON, RETICCTPCT,  in the last 72 hours  Coagulation profile  Recent Labs Lab 03/27/14 1945  INR 1.02    No results found for this basename: DDIMER,  in the last 72 hours  Cardiac Enzymes  Recent Labs Lab 03/27/14 [place] <0.30   ------------------------------------------------------------------------------------------------------------------ No components found with this basename: POCBNP,     Markiya Keefe D.O. on 03/28/2014 at 10:32 AM  Between 7am to 7pm - Pager - [phone]  After 7pm go to [email or web address] - password [email or web address]  And look for the night coverage person  covering for me after hours  [facility] Office  [phone]
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
## 2014-03-28 NOTE — Op Note (Signed)
[facility] [address] [place], [address]   ENDOSCOPY PROCEDURE REPORT  PATIENT: Melissa Garrett, Melissa Garrett  MR#: [id number] BIRTHDATE: [birth year] , 49  yrs. old GENDER: female ENDOSCOPIST: Hildred Laser, MD REFERRED BY:  Pleas Koch, Md PROCEDURE DATE:  13-Apr-2014 PROCEDURE:  EGD, diagnostic and EGD w/ biopsy ASA CLASS:     Class II INDICATIONS:  nausea and weight loss. MEDICATIONS: Cetacaine spray for oral pharyngeal topical anesthesia, Meperidine (Demerol) 50 mg IV, and Versed 6 mg IV TOPICAL ANESTHETIC: Cetacaine Spray  DESCRIPTION OF PROCEDURE: After the risks benefits and alternatives of the procedure were thoroughly explained, informed consent was obtained.  The EG-[id number] ([id number]) endoscope was introduced through the mouth and advanced to the second portion of the duodenum , Without limitations.  The instrument was slowly withdrawn as the mucosa was fully examined.      ESOPHAGUS: The mucosa of the esophagus appeared normal.   The GE junction was located 40 cm from incisors.  STOMACH: A diminutive smooth polyp was found in the gastric fundus. Multiple biopsies was performed using cold forceps.  Sample sent for histology.   The stomach otherwise appeared normal.  DUODENUM: The duodenal mucosa showed no abnormalities in the bulb and 2nd part of the duodenum.  Cold forcep biopsies were taken in the second portion.  Retroflexed views revealed no abnormalities. The scope was then withdrawn from the patient and the procedure completed.  COMPLICATIONS: There were no immediate complications.  ENDOSCOPIC IMPRESSION: 1.  Diminutive polyp in gastric fundus otherwise normal EGD. 2.   Random biopsies from post bulbar mucosa for routine histology.   RECOMMENDATIONS: 1.  Await biopsy results 2.  Solid phase gastric emptying study.  REPEAT EXAM:  eSignedHildred Laser, MD 04/13/14 5:47 PM    CC:  CPT CODES:     43235@Upper  gastrointestinal endoscopy  including esophagus, stomach, and either the duodenum and/or jejunum as appropriate; diagnostic, with or without collection of specimen(s) by brushing or washing (separate procedure) 43239@Upper  gastrointestinal endoscopy including esophagus, stomach, and either the duodenum and/or jejunum as appropriate; with biopsy, single or multiple ICD CODES:  The ICD and CPT codes recommended by this software are interpretations from the data that the clinical staff has captured with the software.  The verification of the translation of this report to the ICD and CPT codes and modifiers is the sole responsibility of the health care institution and practicing physician where this report was generated.  [place]. will not be held responsible for the validity of the ICD and CPT codes included on this report.  AMA assumes no liability for data contained or not contained herein. CPT is a Designer, television/film set of the [employer].  PATIENT NAME:  Synthia, Fairbank MR#: [id number]

## 2014-03-29 ENCOUNTER — Inpatient Hospital Stay (HOSPITAL_COMMUNITY): Payer: Medicare Other

## 2014-03-29 ENCOUNTER — Encounter (HOSPITAL_COMMUNITY): Payer: Self-pay | Admitting: Internal Medicine

## 2014-03-29 DIAGNOSIS — R634 Abnormal weight loss: Secondary | ICD-10-CM

## 2014-03-29 DIAGNOSIS — K3184 Gastroparesis: Principal | ICD-10-CM

## 2014-03-29 LAB — BASIC METABOLIC PANEL
ANION GAP: 11 (ref 5–15)
BUN: 19 mg/dL (ref 6–23)
CHLORIDE: 106 meq/L (ref 96–112)
CO2: 23 mEq/L (ref 19–32)
Calcium: 9.1 mg/dL (ref 8.4–10.5)
Creatinine, Ser: 1.86 mg/dL — ABNORMAL HIGH (ref 0.50–1.10)
GFR calc non Af Amer: 26 mL/min — ABNORMAL LOW (ref >90)
GFR, EST AFRICAN AMERICAN: 30 mL/min — AB (ref >90)
Glucose, Bld: 82 mg/dL (ref 70–99)
POTASSIUM: 5.1 meq/L (ref 3.7–5.3)
Sodium: 140 mEq/L (ref 137–147)

## 2014-03-29 LAB — CBC
HCT: 31.3 % — ABNORMAL LOW (ref 36.0–46.0)
Hemoglobin: 10.5 g/dL — ABNORMAL LOW (ref 12.0–15.0)
MCH: 30.7 pg (ref 26.0–34.0)
MCHC: 33.5 g/dL (ref 30.0–36.0)
MCV: 91.5 fL (ref 78.0–100.0)
PLATELETS: 188 10*3/uL (ref 150–400)
RBC: 3.42 MIL/uL — ABNORMAL LOW (ref 3.87–5.11)
RDW: 14.3 % (ref 11.5–15.5)
WBC: 6.1 10*3/uL (ref 4.0–10.5)

## 2014-03-29 LAB — LIPASE, BLOOD: LIPASE: 40 U/L (ref 11–59)

## 2014-03-29 MED ORDER — METOCLOPRAMIDE HCL 10 MG PO TABS
5.0000 mg | ORAL_TABLET | Freq: Three times a day (TID) | ORAL | Status: DC
  Administered 2014-03-29: 5 mg via ORAL
  Filled 2014-03-29: qty 1

## 2014-03-29 MED ORDER — HYDRALAZINE HCL 25 MG PO TABS: 25.0000 mg | ORAL_TABLET | Freq: Three times a day (TID) | ORAL | Status: DC

## 2014-03-29 MED ORDER — METOCLOPRAMIDE HCL 5 MG PO TABS: 5.0000 mg | ORAL_TABLET | Freq: Three times a day (TID) | ORAL | Status: DC

## 2014-03-29 MED ORDER — LOPERAMIDE HCL 2 MG PO CAPS: 2.0000 mg | ORAL_CAPSULE | Freq: Two times a day (BID) | ORAL | Status: DC | PRN

## 2014-03-29 MED ORDER — TECHNETIUM TC 99M SULFUR COLLOID
2.0000 | Freq: Once | INTRAVENOUS | Status: AC | PRN
  Administered 2014-03-29: 2 via INTRAVENOUS

## 2014-03-29 NOTE — Discharge Instructions (Signed)
Abdominal Pain °Many things can cause abdominal pain. Usually, abdominal pain is not caused by a disease and will improve without treatment. It can often be observed and treated at home. Your health care provider will do a physical exam and possibly order blood tests and X-rays to help determine the seriousness of your pain. However, in many cases, more time must pass before a clear cause of the pain can be found. Before that point, your health care provider may not know if you need more testing or further treatment. °HOME CARE INSTRUCTIONS  °Monitor your abdominal pain for any changes. The following actions may help to alleviate any discomfort you are experiencing: °· Only take over-the-counter or prescription medicines as directed by your health care provider. °· Do not take laxatives unless directed to do so by your health care provider. °· Try a clear liquid diet (broth, tea, or water) as directed by your health care provider. Slowly move to a bland diet as tolerated. °SEEK MEDICAL CARE IF: °· You have unexplained abdominal pain. °· You have abdominal pain associated with nausea or diarrhea. °· You have pain when you urinate or have a bowel movement. °· You experience abdominal pain that wakes you in the night. °· You have abdominal pain that is worsened or improved by eating food. °· You have abdominal pain that is worsened with eating fatty foods. °· You have a fever. °SEEK IMMEDIATE MEDICAL CARE IF:  °· Your pain does not go away within 2 hours. °· You keep throwing up (vomiting). °· Your pain is felt only in portions of the abdomen, such as the right side or the left lower portion of the abdomen. °· You pass bloody or black tarry stools. °MAKE SURE YOU: °· Understand these instructions.   °· Will watch your condition.   °· Will get help right away if you are not doing well or get worse.   °Document Released: 03/17/2005 Document Revised: 06/12/2013 Document Reviewed: 02/14/2013 °ExitCare® Patient Information  ©2015 [employer]. This information is not intended to replace advice given to you by your health care provider. Make sure you discuss any questions you have with your health care provider. ° °Gastroparesis  °Gastroparesis is also called slowed stomach emptying (delayed gastric emptying). It is a condition in which the stomach takes too long to empty its contents. It often happens in people with diabetes.  °CAUSES  °Gastroparesis happens when nerves to the stomach are damaged or stop working. When the nerves are damaged, the muscles of the stomach and intestines do not work normally. The movement of food is slowed or stopped. High blood glucose (sugar) causes changes in nerves and can damage the blood vessels that carry oxygen and nutrients to the nerves. °RISK FACTORS °· Diabetes. °· Post-viral syndromes. °· Eating disorders (anorexia, bulimia). °· Surgery on the stomach or vagus nerve. °· Gastroesophageal reflux disease (rarely). °· Smooth muscle disorders (amyloidosis, scleroderma). °· Metabolic disorders, including hypothyroidism. °· Parkinson disease. °SYMPTOMS  °· Heartburn. °· Feeling sick to your stomach (nausea). °· Vomiting of undigested food. °· An early feeling of fullness when eating. °· Weight loss. °· Abdominal bloating. °· Erratic blood glucose levels. °· Lack of appetite. °· Gastroesophageal reflux. °· Spasms of the stomach wall. °Complications can include: °· Bacterial overgrowth in stomach. Food stays in the stomach and can ferment and cause bacteria to grow. °· Weight loss due to difficulty digesting and absorbing nutrients. °· Vomiting. °· Obstruction in the stomach. Undigested food can harden and cause nausea and vomiting. °·   Blood glucose fluctuations caused by inconsistent food absorption. °DIAGNOSIS  °The diagnosis of gastroparesis is confirmed through one or more of the following tests: °· Barium X-rays and scans. These tests look at how long it takes for food to move through the  stomach. °· Gastric manometry. This test measures electrical and muscular activity in the stomach. A thin tube is passed down the throat into the stomach. The tube contains a wire that takes measurements of the stomach's electrical and muscular activity as it digests liquids and solid food. °· Endoscopy. This procedure is done with a long, thin tube called an endoscope. It is passed through the mouth and gently down the esophagus into the stomach. This tube helps the caregiver look at the lining of the stomach to check for any abnormalities. °· Ultrasonography. This can rule out gallbladder disease or pancreatitis. This test will outline and define the shape of the gallbladder and pancreas. °TREATMENT  °· Treatments may include: °¨ Exercise. °¨ Medicines to control nausea and vomiting. °¨ Medicines to stimulate stomach muscles. °¨ Changes in what and when you eat. °¨ Having smaller meals more often. °¨ Eating low-fiber forms of high-fiber foods, such as eating cooked vegetables instead of raw vegetables. °¨ Eating low-fat foods. °¨ Consuming liquids, which are easier to digest. °· In severe cases, feeding tubes and intravenous (IV) feeding may be needed. °It is important to note that in most cases, treatment does not cure gastroparesis. It is usually a lasting (chronic) condition. Treatment helps you manage the underlying condition so that you can be as healthy and comfortable as possible. °Other treatments °· A gastric neurostimulator has been developed to assist people with gastroparesis. The battery-operated device is surgically implanted. It emits mild electrical pulses to help improve stomach emptying and to control nausea and vomiting. °· The use of botulinum toxin has been shown to improve stomach emptying by decreasing the prolonged contractions of the muscle between the stomach and the small intestine (pyloric sphincter). The benefits are temporary. °SEEK MEDICAL CARE IF:  °· You have diabetes and you are  having problems keeping your blood glucose in goal range. °· You are having nausea, vomiting, bloating, or early feelings of fullness with eating. °· Your symptoms do not change with a change in diet. °Document Released: 06/07/2005 Document Revised: 10/02/2012 Document Reviewed: 11/14/2008 °ExitCare® Patient Information ©2015 [employer]. This information is not intended to replace advice given to you by your health care provider. Make sure you discuss any questions you have with your health care provider. ° °

## 2014-03-29 NOTE — Progress Notes (Signed)
UR chart review completed.  

## 2014-03-29 NOTE — Progress Notes (Signed)
Patient feels better. No bowel movements today. Gastric emptying study revealed 70% of tracer in the stomach at 2 hours indicator of gastroparesis. GI pathogen panel is negative. Duodenal biopsy is pending.  Assessment; #1. Intractable nausea. Gastric emptying study is abnormal but she could have more than one causes of nausea unless she experiences complete resolution with metoclopramide. #2. Diarrhea of recent onset. C. difficile by PCR and GI pathogen panel are negative and diarrhea has improved. #3. Cholelithiasis. Her symptoms are not suggestive of biliary tract disease and therefore would not recommend cholecystectomy at this time.  Recommendations; As discussed with Dr. Ree Kida a she will go home on metoclopramide 10 mg before each meal. Patient informed of potential side effects and if she has any she can either stop the medication or drop the dose to half. She returns for office visit in 2-3 weeks at which time willl consider colonoscopy.

## 2014-03-29 NOTE — Progress Notes (Signed)
OT Cancellation Note  Patient Details Name: Melissa Garrett MRN: [id number] DOB: 11-29-1940   Cancelled Treatment:    Reason Eval/Treat Not Completed: OT screened, no needs identified, will sign off. Pt is at baseline in all ADL and IADL fudntioning.  She has no concerns about returning to independence upon d/c home and reports having family support when needed.  [name], MS, OTR/L [place] [phone] 03/29/2014, 9:36 AM

## 2014-03-29 NOTE — Progress Notes (Signed)
Pt. Discharged today per Dr. Ree Kida. Pt's IV site D/C'd and WDL. Pt's VSS. Pt. Provided with medication list and discharge instructions. Verbalized understanding. Pt. left floor via WC in stable condition accompanied by NT.

## 2014-03-29 NOTE — Discharge Summary (Signed)
Physician Discharge Summary  Melissa Garrett ZOX:[id number] DOB: 04/21/1941 DOA: 03/27/2014  PCP: Wardell Honour, MD  Admit date: 03/27/2014 Discharge date: 03/29/2014  Time spent: 45 minutes  Recommendations for Outpatient Follow-up:  Patient will be discharged to home.  She is to follow up with her primary care physician within one week of discharge and have repeat BMP at that time. Patient should also follow up with Dr. Laural Golden, gastroenterologist, at the specified time. Patient should also contact Dr. Arnoldo Morale, surgeon, for outpatient appointment.  Patient to continue taking her medications as prescribed. Patient may resume activity as tolerated. Patient should follow a heart healthy diet.  Discharge Diagnoses:  Chronic nausea weight loss Possible gastroparesis Acute diarrhea Acute on chronic kidney disease, stage III Cholelithiasis/Abnormal appearing gallbladder on CT Elevated lipase Peripheral arterial disease in the abdomen, incidental finding Essential hypertension History of dysphagia with esophageal dilatation  Discharge Condition: Stable   Diet recommendation: Heart healthy  Filed Weights   03/27/14 1904 03/28/14 0340  Weight: 68.04 kg (150 lb) 68.13 kg (150 lb 3.2 oz)    History of present illness:  on 03/27/2014  Melissa Garrett is a 73 y.o. female with a past medical history of essential hypertension, GERD, history of H. pylori in the past, history of esophageal stricture, status post agitation about 12-13 years ago, chronic kidney disease, stage III, who has had nausea for the last many months. As a result of, which she has had very poor oral intake. She has lost about 50 pounds in the last 6 months. She has had periods of vomiting as well, but none recently. Few days ago, she went to see her primary care physician and was diagnosed with a UTI and was started on Bactrim. Three days ago she started having loose stools, and must have at least 7-8 loose bowel movements. Denied any  blood in the stool. She went to [facility] yesterday with similar complaints and they did a Hemoccult testing, which was negative. She denied any vaginal discharge. No urinary discomfort. Has not ever had a colonoscopy. Denied any dizziness, lightheadedness. About a month ago her medications for hypertension were changed over from atenolol to lisinopril. This was done due to bradycardia. She denied any abdominal pain. No headaches. No shortness of breath or chest pains.  Hospital Course:  Chronic nausea with weight loss  -Possibly secondary to gastroparesis -Gastroenterology has been consulted and appreciated  -Continue PPI  -EGD: Diminutive polyp in gastric fundus, otherwise normal EGD, biopsies taken -Gastric emptying study: Abnormal gastric emptying. 75% retention at 2 hr. -Will start patient on Reglan -She will need to follow up with Dr. Laural Golden for colonoscopy as well as follow up visit -Continue antiemetics as needed   Possible Gastroparesis -Patient had abnormal gastric emptying study -Started on Reglan  Acute diarrhea  -Likely secondary to antibiotics, Bactrim  -Improved -Abdominal exam is benign  -Stool/GI Pathogen panel negative -Abdominal WJ:[id number] (approximately 2 small stones) and gallbladder sludge.   Acute on chronic kidney disease, stage III  -Improving, Likely secondary to dehydration from diarrhea versus lisinopril  -patient should have a repeat BMP in one week after discharge  Cholelithiasis/ Abnormal appearing gallbladder on CT  -May also be contributing to her nausea although abdominal exam is benign  -Abdominal QM:[id number] (approximately 2 small stones) and gallbladder sludge. -Spoke with Dr. Arnoldo Morale who recommended outpatient follow up  Elevated lipase  -Resolved, Likely secondary to nausea   Peripheral Arterial disease in the abdomen, incidental finding  -Patient does have good  femoral pulses, appears to be asymptomatic at this time    -Will need to follow up with her PCP and/or vascular physician as an outpatient   Essential hypertension  -Due to her acute kidney injury, lisinopril will be held  -Patient was recently switched to lisinopril due to to bradycardia on a beta blocker.  -Will discharge patient with hydralazine  History of dysphagia with esophageal dilatation  -Gastroenterology consulted and appreciated -Stable  Procedures: EGD Gastric Emptying Study Abdominal US  Consultations: Dr. Laural Golden, Gastroenterology Dr. Arnoldo Morale, surgery, via phone  Discharge Exam: Filed Vitals:   03/29/14 0503  BP: 144/46  Pulse: 63  Temp: 98.1 F (36.7 C)  Resp: 20   Exam  General: Well developed, well nourished, NAD, appears stated age  HEENT: NCAT, mucous membranes moist.  Cardiovascular: S1 S2 auscultated, no rubs, murmurs or gallops. Regular rate and rhythm.  Respiratory: Clear to auscultation bilaterally with equal chest rise  Abdomen: Soft, nontender, nondistended, + bowel sounds  Extremities: warm dry without cyanosis clubbing or edema  Neuro: AAOx3, no focal deficits Psych: Normal affect and demeanor with intact judgement and insight  Discharge Instructions      Discharge Instructions   Discharge instructions    Complete by:  As directed   Patient will be discharged to home.  She is to follow up with her primary care physician within one week of discharge and have repeat BMP at that time. Patient should also follow up with Dr. Laural Golden, gastroenterologist, at the specified time. Patient should also contact Dr. Arnoldo Morale, surgeon, for outpatient appointment.  Patient to continue taking her medications as prescribed. Patient may resume activity as tolerated. Patient should follow a heart healthy diet.            Medication List    STOP taking these medications       lisinopril-hydrochlorothiazide 10-12.5 MG per tablet  Commonly known as:  PRINZIDE,ZESTORETIC     sulfamethoxazole-trimethoprim 800-160  MG per tablet  Commonly known as:  BACTRIM DS      TAKE these medications       allopurinol 100 MG tablet  Commonly known as:  ZYLOPRIM  Take 100 mg by mouth daily.     aspirin EC 81 MG tablet  Take 81 mg by mouth daily.     esomeprazole 40 MG capsule  Commonly known as:  NEXIUM  Take 40 mg by mouth daily at 12 noon.     hydrALAZINE 25 MG tablet  Commonly known as:  APRESOLINE  Take 1 tablet (25 mg total) by mouth 3 (three) times daily.     LECITHIN PO  Take 1 tablet by mouth daily.     loperamide 2 MG capsule  Commonly known as:  IMODIUM  Take 1 capsule (2 mg total) by mouth 2 (two) times daily as needed for diarrhea or loose stools.     metoCLOPramide 5 MG tablet  Commonly known as:  REGLAN  Take 1 tablet (5 mg total) by mouth 3 (three) times daily before meals.     multivitamin with minerals Tabs tablet  Take 1 tablet by mouth daily.     ondansetron 4 MG tablet  Commonly known as:  ZOFRAN  Take 1 tablet by mouth every 6 (six) hours as needed for nausea or vomiting.     oxybutynin 5 MG tablet  Commonly known as:  DITROPAN  Take 1 tablet by mouth daily.       Allergies  Allergen Reactions  . Ciprofloxacin Other (  See Comments)    Patient states "feel sorta crazy"   Follow-up Information   Follow up with MILLER, Lillette Boxer, MD. Schedule an appointment as soon as possible for a visit in 1 week. [facility] followup, Repeat BMP)    Specialty:  Family Medicine   Contact information:   [place] [address] [phone]       Follow up with REHMAN,NAJEEB U, MD. Schedule an appointment as soon as possible for a visit in 1 week. [facility] followup)    Specialty:  Gastroenterology   Contact information:   [address] [place] [address] [phone]       Follow up with Jamesetta So, MD. Schedule an appointment as soon as possible for a visit in 2 weeks. [facility] follow, gallbladder stones)    Specialty:  General Surgery   Contact  information:   [address] [place] [address] [phone]        The results of significant diagnostics from this hospitalization (including imaging, microbiology, ancillary and laboratory) are listed below for reference.    Significant Diagnostic Studies: Ct Abdomen Pelvis Wo Contrast  03/27/2014   CLINICAL DATA:  Chronic nausea for 4-5 months. Has lost 50 lb in 6 months, due to loss of appetite. Initial encounter.  EXAM: CT ABDOMEN AND PELVIS WITHOUT CONTRAST  TECHNIQUE: Multidetector CT imaging of the abdomen and pelvis was performed following the standard protocol without IV contrast.  COMPARISON:  None.  FINDINGS: The visualized lung bases are clear.  A small calcified granuloma is noted in the periphery of the right hepatic lobe. The liver is otherwise unremarkable. A small stone is noted within the gallbladder, with likely a small amount of higher attenuation sludge. The gallbladder is otherwise unremarkable. The pancreas and adrenal glands are unremarkable.  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 Nonspecific perinephric stranding is noted bilaterally. The kidneys are otherwise unremarkable. There is no evidence of hydronephrosis. No renal or ureteral stones are seen.  No free fluid is identified. The small bowel is unremarkable in appearance. The stomach is within normal limits. No acute vascular abnormalities are seen. Diffuse calcification is seen along the abdominal aorta, with likely severe narrowing of the proximal right common iliac artery, and mild to moderate narrowing of the left common iliac artery.  The appendix is normal in caliber and contains trace air, without evidence for appendicitis. The colon is partially filled with air and fluid. The sigmoid colon is mildly redundant. The colon is unremarkable in appearance.  The bladder is relatively decompressed and not well assessed. The patient is status post  hysterectomy. No suspicious adnexal masses are seen. No inguinal lymphadenopathy is seen.  No acute osseous abnormalities are identified. Mild diffuse sclerotic change involving the visualized osseous structures may reflect an underlying systemic condition.  IMPRESSION: 1. No acute abnormality seen in the abdomen or pelvis. 2. Diffuse mild osteosclerosis noted, which may reflect a variety of systemic conditions. Further evaluation is suggested as deemed clinically appropriate. 3. Small stone within the gallbladder, with a likely small amount of sludge. Gallbladder otherwise unremarkable in appearance. 4. 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 5. Diffuse calcification along the abdominal aorta, with likely severe narrowing of the proximal right common iliac artery, and mild to moderate narrowing of the left common iliac artery.   Electronically Signed   By: Garald Balding M.D.   On:  03/27/2014 21:56   Nm Gastric Emptying  03/29/2014   CLINICAL DATA:  Nausea and anorexia.  Abnormal EGD.  EXAM: NUCLEAR MEDICINE GASTRIC EMPTYING SCAN  TECHNIQUE: After oral ingestion of radiolabeled meal, sequential abdominal images were obtained for 120 minutes. Residual percentage of activity remaining within the stomach was calculated at 60 and 120 minutes.  RADIOPHARMACEUTICALS:  2 mCi  Technetium 99-m labeled sulfur colloid  COMPARISON:  None.  FINDINGS: Expected location of the stomach in the left upper quadrant. Ingested meal empties the stomach gradually over the course of the study with 90% retention at 60 min and 75% retention at 120 min (normal retention less than 30% at a 120 min).  IMPRESSION: Abnormal gastric emptying.  75% retention at 2 hr.   Electronically Signed   By: Franchot Gallo M.D.   On: 03/29/2014 13:54   US Abdomen Complete  03/28/2014   CLINICAL DATA:  Nausea and vomiting for 6 months  EXAM: ULTRASOUND ABDOMEN COMPLETE  COMPARISON:  CT abdomen and pelvis without contrast  03/27/2014  FINDINGS: Gallbladder: Approximately 2 small gallstones are seen within the gallbladder lumen. There is a small amount of gallbladder sludge. Gallbladder wall thickness is normal. The sonographic Murphy sign is negative.  Common bile duct: Diameter: 6 mm  Liver: No focal lesion identified. Within normal limits in parenchymal echogenicity.  IVC: No abnormality visualized.  Pancreas: Visualized portion unremarkable.  Spleen: Size and appearance within normal limits.  Right Kidney: Length: 10.7 cm. Echogenicity within normal limits. No mass or hydronephrosis visualized.  Left Kidney: Length: 10.8 cm. Echogenicity within normal limits. No mass or hydronephrosis visualized.  Abdominal aorta: Atherosclerotic calcification visualized in the abdominal aorta. No evidence of aneurysm.  Other findings: None.  IMPRESSION: 1. Cholelithiasis (approximately 2 small stones) and gallbladder sludge. Gallbladder wall thickness is normal and the sonographic Murphy sign is negative. 2. Abdominal aorta atherosclerosis.   Electronically Signed   By: Curlene Dolphin M.D.   On: 03/28/2014 18:06    Microbiology: Recent Results (from the past 240 hour(s))  CLOSTRIDIUM DIFFICILE BY PCR     Status: None   Collection Time    03/28/14  5:07 AM      Result Value Ref Range Status   C difficile by pcr NEGATIVE  NEGATIVE Final     Labs: Basic Metabolic Panel:  Recent Labs Lab 03/27/14 1945 03/28/14 0617 03/29/14 0555  NA 133* 138 140  K 4.7 5.0 5.1  CL 97 105 106  CO2 21 23 23   GLUCOSE 103* 81 82  BUN 25* 23 19  CREATININE 2.54* 2.27* 1.86*  CALCIUM 9.5 9.1 9.1   Liver Function Tests:  Recent Labs Lab 03/27/14 1945 03/28/14 0617  AST 29 24  ALT 18 14  ALKPHOS 36* 33*  BILITOT 0.3 0.3  PROT 6.6 6.1  ALBUMIN 3.6 3.2*    Recent Labs Lab 03/27/14 1945 03/28/14 0617 03/29/14 0555  LIPASE 60* 57 40   No results found for this basename: AMMONIA,  in the last 168 hours CBC:  Recent Labs Lab  03/27/14 1945 03/28/14 0617 03/29/14 0555  WBC 9.2 7.7 6.1  NEUTROABS 5.6  --   --   HGB 11.3* 10.8* 10.5*  HCT 32.4* 31.7* 31.3*  MCV 90.0 90.8 91.5  PLT 218 191 188   Cardiac Enzymes:  Recent Labs Lab 03/27/14 1945  TROPONINI <0.30   BNP: BNP (last 3 results) No results found for this basename: PROBNP,  in the last 8760 hours CBG: No  results found for this basename: GLUCAP,  in the last 168 hours     Signed:  Cristal Ford  [facility] 03/29/2014, 3:17 PM

## 2014-03-29 NOTE — Care Management Note (Signed)
    Page 1 of 1   03/29/2014     1:29:03 PM CARE MANAGEMENT NOTE 03/29/2014  Patient:  Melissa Garrett, Melissa Garrett   Account Number:  [id number]  Date Initiated:  03/29/2014  Documentation initiated by:  Theophilus Kinds  Subjective/Objective Assessment:   Pt admitted from home with renal failure. Pt lives with her daughter and will return home at discharge. Pt has a cane and walker and is fairly independent with ADL's.     Action/Plan:   No CM needs noted.   Anticipated DC Date:  03/30/2014   Anticipated DC Plan:  [place]  CM consult      Choice offered to / List presented to:             Status of service:  Completed, signed off Medicare Important Message given?  YES (If response is "NO", the following Medicare IM given date fields will be blank) Date Medicare IM given:  03/29/2014 Medicare IM given by:  Theophilus Kinds Date Additional Medicare IM given:   Additional Medicare IM given by:    Discharge Disposition:  HOME/SELF CARE  Per UR Regulation:    If discussed at Long Length of Stay Meetings, dates discussed:    Comments:  03/29/14 [place], RN BSN CM
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
## 2014-04-01 ENCOUNTER — Telehealth: Payer: Self-pay | Admitting: Family Medicine

## 2014-04-01 NOTE — Telephone Encounter (Signed)
Patient states that she has an appt on 10-23 and she was d/cd from hospital this past Friday and they wanted her to follow up this week. Patient only wants to see Dr Sabra Heck and he has no appts for this week. Offered appt with another provider and patient refused. Will keep her appt for the 23rd and will call tomorrow and Wednesday to check for cancellations with [facility]

## 2014-04-12 ENCOUNTER — Ambulatory Visit (INDEPENDENT_AMBULATORY_CARE_PROVIDER_SITE_OTHER): Payer: Medicare Other | Admitting: Family Medicine

## 2014-04-12 ENCOUNTER — Encounter: Payer: Self-pay | Admitting: Family Medicine

## 2014-04-12 VITALS — BP 143/66 | HR 70 | Temp 97.1°F | Ht 64.0 in | Wt 149.8 lb

## 2014-04-12 DIAGNOSIS — I10 Essential (primary) hypertension: Secondary | ICD-10-CM | POA: Diagnosis not present

## 2014-04-12 DIAGNOSIS — N183 Chronic kidney disease, stage 3 unspecified: Secondary | ICD-10-CM

## 2014-04-12 NOTE — Progress Notes (Signed)
   Subjective:    Patient ID: Melissa Garrett, female    DOB: 15-Feb-1941, 73 y.o.   MRN: [id number]  HPI 73 year old female who is here to followup gastroparesis. She had endoscopy earlier this month and was to found have some gastric outlet obstruction and was given metoclopramide but does not take it because she read the list of side effects to she really is doing pretty well. She also has gallstones it was found on recent ER visit but she is not contemplating surgery unless there are more symptomatic.    Review of Systems  Constitutional: Negative.   HENT: Negative.   Cardiovascular: Negative.   Gastrointestinal: Positive for abdominal distention.  Genitourinary: Negative.   Neurological: Negative.   Psychiatric/Behavioral: Negative.        Objective:   Physical Exam  Constitutional: She is oriented to person, place, and time. She appears well-developed and well-nourished.  Eyes: Conjunctivae and EOM are normal.  Neck: Normal range of motion. Neck supple.  Cardiovascular: Normal rate, regular rhythm and normal heart sounds.   Pulmonary/Chest: Effort normal and breath sounds normal.  Abdominal: Soft. Bowel sounds are normal.  Musculoskeletal: Normal range of motion.  Neurological: She is alert and oriented to person, place, and time. She has normal reflexes.  Skin: Skin is warm and dry.  Psychiatric: She has a normal mood and affect. Her behavior is normal. Thought content normal.   BP 143/66  Pulse 70  Temp(Src) 97.1 F (36.2 C) (Oral)  Ht 5\' 4"  (1.626 m)  Wt 149 lb 12.8 oz (67.949 kg)  BMI 25.70 kg/m2       Assessment & Plan:

## 2014-04-12 NOTE — Progress Notes (Signed)
   Subjective:    Patient ID: Melissa Garrett, female    DOB: 24-Sep-1940, 73 y.o.   MRN: [id number]  HPI    Review of Systems     Objective:   Physical Exam        Assessment & Plan:  1. Essential hypertension   2. CKD (chronic kidney disease) stage 3, GFR 30-59 ml/min 3. N&V  Pt asymptomatic now; will use regaln prn  Wardell Honour MD

## 2014-04-22 ENCOUNTER — Ambulatory Visit (INDEPENDENT_AMBULATORY_CARE_PROVIDER_SITE_OTHER): Payer: Medicare Other | Admitting: Internal Medicine

## 2014-04-25 ENCOUNTER — Telehealth (INDEPENDENT_AMBULATORY_CARE_PROVIDER_SITE_OTHER): Payer: Self-pay | Admitting: *Deleted

## 2014-04-25 ENCOUNTER — Other Ambulatory Visit (INDEPENDENT_AMBULATORY_CARE_PROVIDER_SITE_OTHER): Payer: Self-pay | Admitting: *Deleted

## 2014-04-25 ENCOUNTER — Ambulatory Visit (INDEPENDENT_AMBULATORY_CARE_PROVIDER_SITE_OTHER): Payer: Medicare Other | Admitting: Internal Medicine

## 2014-04-25 ENCOUNTER — Encounter (INDEPENDENT_AMBULATORY_CARE_PROVIDER_SITE_OTHER): Payer: Self-pay | Admitting: Internal Medicine

## 2014-04-25 VITALS — BP 104/50 | HR 60 | Temp 97.6°F | Ht 64.0 in | Wt 149.2 lb

## 2014-04-25 DIAGNOSIS — Z1211 Encounter for screening for malignant neoplasm of colon: Secondary | ICD-10-CM

## 2014-04-25 DIAGNOSIS — R110 Nausea: Secondary | ICD-10-CM

## 2014-04-25 DIAGNOSIS — R197 Diarrhea, unspecified: Secondary | ICD-10-CM

## 2014-04-25 NOTE — Patient Instructions (Signed)
Colonoscopy.  The risks and benefits such as perforation, bleeding, and infection were reviewed with the patient and is agreeable. 

## 2014-04-25 NOTE — Telephone Encounter (Signed)
Patient needs trilyte 
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
## 2014-04-29 MED ORDER — PEG 3350-KCL-NA BICARB-NACL 420 G PO SOLR: 4000.0000 mL | Freq: Once | ORAL | Status: DC

## 2014-05-29 ENCOUNTER — Encounter (HOSPITAL_COMMUNITY): Payer: Self-pay | Admitting: *Deleted

## 2014-05-29 ENCOUNTER — Ambulatory Visit (HOSPITAL_COMMUNITY)
Admission: RE | Admit: 2014-05-29 | Discharge: 2014-05-29 | Disposition: A | Discharge status: Home / Self Care | Payer: Medicare Other | Source: Ambulatory Visit | Attending: Internal Medicine | Admitting: Internal Medicine

## 2014-05-29 ENCOUNTER — Encounter (HOSPITAL_COMMUNITY)
Admission: RE | Disposition: A | Discharge status: Home / Self Care | Payer: Self-pay | Source: Ambulatory Visit | Attending: Internal Medicine

## 2014-05-29 DIAGNOSIS — M109 Gout, unspecified: Secondary | ICD-10-CM | POA: Diagnosis not present

## 2014-05-29 DIAGNOSIS — D128 Benign neoplasm of rectum: Secondary | ICD-10-CM | POA: Diagnosis not present

## 2014-05-29 DIAGNOSIS — K644 Residual hemorrhoidal skin tags: Secondary | ICD-10-CM | POA: Diagnosis not present

## 2014-05-29 DIAGNOSIS — Z1211 Encounter for screening for malignant neoplasm of colon: Secondary | ICD-10-CM

## 2014-05-29 DIAGNOSIS — K6389 Other specified diseases of intestine: Secondary | ICD-10-CM | POA: Diagnosis not present

## 2014-05-29 DIAGNOSIS — K219 Gastro-esophageal reflux disease without esophagitis: Secondary | ICD-10-CM | POA: Insufficient documentation

## 2014-05-29 DIAGNOSIS — K5900 Constipation, unspecified: Secondary | ICD-10-CM | POA: Insufficient documentation

## 2014-05-29 DIAGNOSIS — K635 Polyp of colon: Secondary | ICD-10-CM | POA: Diagnosis not present

## 2014-05-29 DIAGNOSIS — E785 Hyperlipidemia, unspecified: Secondary | ICD-10-CM | POA: Insufficient documentation

## 2014-05-29 DIAGNOSIS — Z79899 Other long term (current) drug therapy: Secondary | ICD-10-CM | POA: Diagnosis not present

## 2014-05-29 DIAGNOSIS — K649 Unspecified hemorrhoids: Secondary | ICD-10-CM | POA: Diagnosis not present

## 2014-05-29 DIAGNOSIS — Z7982 Long term (current) use of aspirin: Secondary | ICD-10-CM | POA: Diagnosis not present

## 2014-05-29 DIAGNOSIS — I10 Essential (primary) hypertension: Secondary | ICD-10-CM | POA: Insufficient documentation

## 2014-05-29 HISTORY — PX: COLONOSCOPY: SHX5424

## 2014-05-29 SURGERY — COLONOSCOPY
Anesthesia: Moderate Sedation

## 2014-05-29 MED ORDER — MEPERIDINE HCL 50 MG/ML IJ SOLN
INTRAMUSCULAR | Status: DC
Start: 2014-05-29 — End: 2014-05-29
  Filled 2014-05-29: qty 1

## 2014-05-29 MED ORDER — MEPERIDINE HCL 50 MG/ML IJ SOLN
INTRAMUSCULAR | Status: DC | PRN
  Administered 2014-05-29: 25 mg via INTRAVENOUS

## 2014-05-29 MED ORDER — MIDAZOLAM HCL 5 MG/5ML IJ SOLN
INTRAMUSCULAR | Status: AC
  Filled 2014-05-29: qty 10

## 2014-05-29 MED ORDER — MIDAZOLAM HCL 5 MG/5ML IJ SOLN
INTRAMUSCULAR | Status: DC | PRN
  Administered 2014-05-29: 2 mg via INTRAVENOUS
  Administered 2014-05-29 (×2): 1 mg via INTRAVENOUS
  Administered 2014-05-29: 2 mg via INTRAVENOUS

## 2014-05-29 MED ORDER — STERILE WATER FOR IRRIGATION IR SOLN
Status: DC | PRN
  Administered 2014-05-29: 11:00:00

## 2014-05-29 MED ORDER — SODIUM CHLORIDE 0.9 % IV SOLN
INTRAVENOUS | Status: DC
  Administered 2014-05-29: 1000 mL via INTRAVENOUS

## 2014-05-29 NOTE — Op Note (Signed)
COLONOSCOPY PROCEDURE REPORT  PATIENT:  Melissa Garrett  MR#:  [id number] Birthdate:  September 29, 1940, 73 y.o., female Endoscopist:  Dr. Rogene Houston, MD Referred By:  Dr. Wardell Honour, MD  Procedure Date: 05/29/2014  Procedure:   Colonoscopy with snare polypectomy.  Indications:  Patient is 73 year old Caucasian female was undergoing average risk screening colonoscopy. She has chronic constipation and dependent on laxatives. This is patient's first exam.  Informed Consent:  The procedure and risks were reviewed with the patient and informed consent was obtained.  Medications:  Demerol 25  mg IV Versed 6mg  IV  Description of procedure:  After a digital rectal exam was performed, that colonoscope was advanced from the anus through the rectum and colon to the area of the cecum, ileocecal valve and appendiceal orifice. The cecum was deeply intubated. These structures were well-seen and photographed for the record. From the level of the cecum and ileocecal valve, the scope was slowly and cautiously withdrawn. The mucosal surfaces were carefully surveyed utilizing scope tip to flexion to facilitate fold flattening as needed. The scope was pulled down into the rectum where a thorough exam including retroflexion was performed.  Findings:   Prep excellent. Redundant colon with diffuse mucosal pigmentation. 10 mm broad-based polyp hot snared from rectum. Small external hemorrhoids and single anal papilla.   Therapeutic/Diagnostic Maneuvers Performed:  See above  Complications:  None  Cecal Withdrawal Time:  9 minutes  Impression:  Examination performed to cecum. Redundant colon with diffuse changes of melanosis coli. 10 mm broad-based polyp hot snared from rectum.  Recommendations:  Standard instructions given. Standard instructions given. I will contact patient with biopsy results and further recommendations.  REHMAN,NAJEEB U  05/29/2014 11:53 AM  CC: Dr. Wardell Honour, MD & Dr. Rayne Du  ref. provider found
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
## 2014-05-29 NOTE — Discharge Instructions (Addendum)
No aspirin or NSAIDs for 1 week. °Resume other medications as before. °High fiber diet. °No driving for 24 hours. °Physician will call with biopsy results. ° °Colonoscopy, Care After °Refer to this sheet in the next few weeks. These instructions provide you with information on caring for yourself after your procedure. Your health care provider may also give you more specific instructions. Your treatment has been planned according to current medical practices, but problems sometimes occur. Call your health care provider if you have any problems or questions after your procedure. °WHAT TO EXPECT AFTER THE PROCEDURE  °After your procedure, it is typical to have the following: °· A small amount of blood in your stool. °· Moderate amounts of gas and mild abdominal cramping or bloating. °HOME CARE INSTRUCTIONS °· Do not drive, operate machinery, or sign important documents for 24 hours. °· You may shower and resume your regular physical activities, but move at a slower pace for the first 24 hours. °· Take frequent rest periods for the first 24 hours. °· Walk around or put a warm pack on your abdomen to help reduce abdominal cramping and bloating. °· Drink enough fluids to keep your urine clear or pale yellow. °· You may resume your normal diet as instructed by your health care provider. Avoid heavy or fried foods that are hard to digest. °· Avoid drinking alcohol for 24 hours or as instructed by your health care provider. °· Only take over-the-counter or prescription medicines as directed by your health care provider. °· If a tissue sample (biopsy) was taken during your procedure: °¨ Do not take aspirin or blood thinners for 7 days, or as instructed by your health care provider. °¨ Do not drink alcohol for 7 days, or as instructed by your health care provider. °¨ Eat soft foods for the first 24 hours. °SEEK MEDICAL CARE IF: °You have persistent spotting of blood in your stool 2-3 days after the procedure. °SEEK IMMEDIATE  MEDICAL CARE IF: °· You have more than a small spotting of blood in your stool. °· You pass large blood clots in your stool. °· Your abdomen is swollen (distended). °· You have nausea or vomiting. °· You have a fever. °· You have increasing abdominal pain that is not relieved with medicine. °Document Released: 01/20/2004 Document Revised: 03/28/2013 Document Reviewed: 02/12/2013 °Exit[employer]® Patient Information ©2015 [employer]. This information is not intended to replace advice given to you by your health care provider. Make sure you discuss any questions you have with your health care provider. ° ° °High-Fiber Diet °Fiber is found in fruits, vegetables, and grains. A high-fiber diet encourages the addition of more whole grains, legumes, fruits, and vegetables in your diet. The recommended amount of fiber for adult males is 38 g per day. For adult females, it is 25 g per day. Pregnant and lactating women should get 28 g of fiber per day. If you have a digestive or bowel problem, ask your caregiver for advice before adding high-fiber foods to your diet. Eat a variety of high-fiber foods instead of only a select few type of foods.  °PURPOSE °· To increase stool bulk. °· To make bowel movements more regular to prevent constipation. °· To lower cholesterol. °· To prevent overeating. °WHEN IS THIS DIET USED? °· It may be used if you have constipation and hemorrhoids. °· It may be used if you have uncomplicated diverticulosis (intestine condition) and irritable bowel syndrome. °· It may be used if you need help with weight management. °·   It may be used if you want to add it to your diet as a protective measure against atherosclerosis, diabetes, and cancer. °SOURCES OF FIBER °· Whole-grain breads and cereals. °· Fruits, such as apples, oranges, bananas, berries, prunes, and pears. °· Vegetables, such as green peas, carrots, sweet potatoes, beets, broccoli, cabbage, spinach, and artichokes. °· Legumes, such split peas,  soy, lentils. °· Almonds. °FIBER CONTENT IN FOODS °Starches and Grains / Dietary Fiber (g) °· Cheerios, 1 cup / 3 g °· Corn Flakes cereal, 1 cup / 0.7 g °· Rice crispy treat cereal, 1¼ cup / 0.3 g °· Instant oatmeal (cooked), ½ cup / 2 g °· Frosted wheat cereal, 1 cup / 5.1 g °· Brown, long-grain rice (cooked), 1 cup / 3.5 g °· White, long-grain rice (cooked), 1 cup / 0.6 g °· Enriched macaroni (cooked), 1 cup / 2.5 g °Legumes / Dietary Fiber (g) °· Baked beans (canned, plain, or vegetarian), ½ cup / 5.2 g °· Kidney beans (canned), ½ cup / 6.8 g °· Pinto beans (cooked), ½ cup / 5.5 g °Breads and Crackers / Dietary Fiber (g) °· Plain or honey graham crackers, 2 squares / 0.7 g °· Saltine crackers, 3 squares / 0.3 g °· Plain, salted pretzels, 10 pieces / 1.8 g °· Whole-wheat bread, 1 slice / 1.9 g °· White bread, 1 slice / 0.7 g °· Raisin bread, 1 slice / 1.2 g °· Plain bagel, 3 oz / 2 g °· Flour tortilla, 1 oz / 0.9 g °· Corn tortilla, 1 small / 1.5 g °· Hamburger or hotdog bun, 1 small / 0.9 g °Fruits / Dietary Fiber (g) °· Apple with skin, 1 medium / 4.4 g °· Sweetened applesauce, ½ cup / 1.5 g °· Banana, ½ medium / 1.5 g °· Grapes, 10 grapes / 0.4 g °· Orange, 1 small / 2.3 g °· Raisin, 1.5 oz / 1.6 g °· Melon, 1 cup / 1.4 g °Vegetables / Dietary Fiber (g) °· Green beans (canned), ½ cup / 1.3 g °· Carrots (cooked), ½ cup / 2.3 g °· Broccoli (cooked), ½ cup / 2.8 g °· Peas (cooked), ½ cup / 4.4 g °· Mashed potatoes, ½ cup / 1.6 g °· Lettuce, 1 cup / 0.5 g °· Corn (canned), ½ cup / 1.6 g °· Tomato, ½ cup / 1.1 g °Document Released: 06/07/2005 Document Revised: 12/07/2011 Document Reviewed: 09/09/2011 °Exit[employer]® Patient Information ©2015 [employer]. This information is not intended to replace advice given to you by your health care provider. Make sure you discuss any questions you have with your health care provider. ° °

## 2014-05-30 ENCOUNTER — Encounter (HOSPITAL_COMMUNITY): Payer: Self-pay | Admitting: Internal Medicine

## 2014-06-11 ENCOUNTER — Telehealth: Payer: Self-pay | Admitting: Family Medicine

## 2014-06-11 NOTE — Telephone Encounter (Signed)
Patient was offered an appointment for tomorrow but chose an appt for Monday.  She was scheduled .

## 2014-06-11 NOTE — Telephone Encounter (Signed)
Last visit with you was on 04-12-14.  Please advise.

## 2014-06-12 ENCOUNTER — Ambulatory Visit (INDEPENDENT_AMBULATORY_CARE_PROVIDER_SITE_OTHER): Payer: Medicare Other | Admitting: Family Medicine

## 2014-06-12 ENCOUNTER — Encounter: Payer: Self-pay | Admitting: Family Medicine

## 2014-06-12 VITALS — BP 126/53 | HR 65 | Temp 96.8°F | Ht 64.0 in | Wt 150.0 lb

## 2014-06-12 DIAGNOSIS — I499 Cardiac arrhythmia, unspecified: Secondary | ICD-10-CM

## 2014-06-12 DIAGNOSIS — R3915 Urgency of urination: Secondary | ICD-10-CM

## 2014-06-12 DIAGNOSIS — N3000 Acute cystitis without hematuria: Secondary | ICD-10-CM

## 2014-06-12 DIAGNOSIS — K5901 Slow transit constipation: Secondary | ICD-10-CM | POA: Diagnosis not present

## 2014-06-12 LAB — POCT URINALYSIS DIPSTICK

## 2014-06-12 LAB — POCT UA - MICROSCOPIC ONLY
CASTS, UR, LPF, POC: NEGATIVE
Crystals, Ur, HPF, POC: NEGATIVE
Mucus, UA: NEGATIVE
Yeast, UA: NEGATIVE

## 2014-06-12 MED ORDER — SULFAMETHOXAZOLE-TRIMETHOPRIM 800-160 MG PO TABS: 1.0000 | ORAL_TABLET | Freq: Two times a day (BID) | ORAL | Status: DC

## 2014-06-12 NOTE — Progress Notes (Signed)
Subjective:    Patient ID: Melissa Garrett, female    DOB: [birth year], 73 y.o.   MRN: [id number]  HPI Patient here today for possible uti. He symptoms started about one week ago. The patient has UTI symptoms of painful voiding, urgency, and frequency. She is allergic to ciprofloxacin. The patient is alert and cooperative and has a history of multiple knee surgeries and replacements that did not work and has trouble ambulating and getting on the table. She was able to get on the table today to be evaluated.       Patient Active Problem List   Diagnosis Date Noted  . CKD (chronic kidney disease) stage 3, GFR 30-59 ml/min 03/27/2014  . Acute on chronic renal failure 03/27/2014  . Nausea and vomiting 03/27/2014  . Acute diarrhea 03/27/2014  . Right iliac artery stenosis 03/27/2014  . Nausea & vomiting 03/27/2014  . Essential hypertension 03/22/2014  . Loss of weight 03/22/2014   Outpatient Encounter Prescriptions as of 06/12/2014  Medication Sig  . allopurinol (ZYLOPRIM) 100 MG tablet Take 100 mg by mouth daily.  Marland Kitchen esomeprazole (NEXIUM) 40 MG capsule Take 40 mg by mouth daily at 12 noon.  . hydrALAZINE (APRESOLINE) 25 MG tablet Take 1 tablet by mouth 3 (three) times daily as needed.  Marland Kitchen LECITHIN PO Take 1 tablet by mouth daily.   Marland Kitchen lisinopril-hydrochlorothiazide (PRINZIDE,ZESTORETIC) 10-12.5 MG per tablet Take 1 tablet by mouth daily.  Marland Kitchen loperamide (IMODIUM) 2 MG capsule Take 1 capsule by mouth daily.  . metoCLOPramide (REGLAN) 5 MG tablet Take 1 tablet (5 mg total) by mouth 3 (three) times daily before meals.  . Multiple Vitamin (MULTIVITAMIN WITH MINERALS) TABS tablet Take 1 tablet by mouth daily.  . ondansetron (ZOFRAN) 4 MG tablet Take 1 tablet by mouth every 6 (six) hours as needed for nausea or vomiting.   Marland Kitchen oxybutynin (DITROPAN) 5 MG tablet Take 1 tablet by mouth daily.   . polyethylene glycol-electrolytes (NULYTELY/GOLYTELY) 420 G solution Take 4,000 mLs by mouth once.  .  [DISCONTINUED] lisinopril (PRINIVIL,ZESTRIL) 10 MG tablet Take 10 mg by mouth daily.    Review of Systems  Constitutional: Negative.   HENT: Negative.   Eyes: Negative.   Respiratory: Negative.   Cardiovascular: Negative.   Gastrointestinal: Negative.   Endocrine: Negative.   Genitourinary: Positive for dysuria (slight), urgency and frequency.  Musculoskeletal: Negative.   Skin: Negative.   Allergic/Immunologic: Negative.   Neurological: Negative.   Hematological: Negative.   Psychiatric/Behavioral: Negative.        Objective:   Physical Exam  Constitutional: She appears well-nourished. No distress.  Elderly and alert in a wheelchair with a knee brace on the right knee.  HENT:  Head: Normocephalic.  Eyes: Conjunctivae and EOM are normal. Pupils are equal, round, and reactive to light. Right eye exhibits no discharge.  Neck: Normal range of motion.  Cardiovascular: Normal rate.   Murmur heard. The heart was irregular at 72/m  Pulmonary/Chest: Effort normal and breath sounds normal. No respiratory distress. She has no wheezes. She has no rales.  Abdominal: Soft. Bowel sounds are normal. She exhibits no distension. There is tenderness (slight suprapubic tenderness). There is no rebound and no guarding.  Musculoskeletal: She exhibits edema. She exhibits no tenderness.  Range of motion limited because of knee problems  Neurological: She is alert.  Skin: Skin is warm and dry. No rash noted.  Psychiatric: She has a normal mood and affect. Her behavior is normal. Thought content normal.  Nursing  note and vitals reviewed.  BP 126/53 mmHg  Pulse 65  Temp(Src) 96.8 F (36 C) (Oral)  Ht 5\' 4"  (1.626 m)  Wt 150 lb (68.04 kg)  BMI 25.73 kg/m2  The urinalysis had many WBC and occasional RBC.  Results for orders placed or performed in visit on 06/12/14  POCT urinalysis dipstick  Result Value Ref Range   Color, UA orange    Clarity, UA clear   POCT UA - Microscopic Only    Result Value Ref Range   WBC, Ur, HPF, POC 80-150    RBC, urine, microscopic 1-3    Bacteria, U Microscopic many    Mucus, UA neg    Epithelial cells, urine per micros few    Crystals, Ur, HPF, POC neg    Casts, Ur, LPF, POC neg    Yeast, UA neg         Assessment & Plan:  1. Urgency of urination - POCT urinalysis dipstick - POCT UA - Microscopic Only - Urine culture - sulfamethoxazole-trimethoprim (BACTRIM DS,SEPTRA DS) 800-160 MG per tablet; Take 1 tablet by mouth 2 (two) times daily.  Dispense: 20 tablet; Refill: 0  2. Irregular heartbeat  3. Slow transit constipation  4. Acute cystitis without hematuria - sulfamethoxazole-trimethoprim (BACTRIM DS,SEPTRA DS) 800-160 MG per tablet; Take 1 tablet by mouth 2 (two) times daily.  Dispense: 20 tablet; Refill: 0  Patient Instructions  Take antibiotic as directed Continue to drink plenty of fluids Continue to use MiraLAX for constipation Return FOBT   Arrie Senate MD

## 2014-06-12 NOTE — Patient Instructions (Signed)
Take antibiotic as directed Continue to drink plenty of fluids Continue to use MiraLAX for constipation Return FOBT

## 2014-06-14 LAB — URINE CULTURE

## 2014-06-15 ENCOUNTER — Emergency Department (HOSPITAL_COMMUNITY): Payer: Medicare Other

## 2014-06-15 ENCOUNTER — Emergency Department (HOSPITAL_COMMUNITY)
Admission: EM | Admit: 2014-06-15 | Discharge: 2014-06-16 | Disposition: A | Discharge status: Home / Self Care | Payer: Medicare Other | Attending: Emergency Medicine | Admitting: Emergency Medicine

## 2014-06-15 ENCOUNTER — Encounter (HOSPITAL_COMMUNITY): Payer: Self-pay | Admitting: Emergency Medicine

## 2014-06-15 DIAGNOSIS — K219 Gastro-esophageal reflux disease without esophagitis: Secondary | ICD-10-CM | POA: Diagnosis not present

## 2014-06-15 DIAGNOSIS — I10 Essential (primary) hypertension: Secondary | ICD-10-CM | POA: Diagnosis not present

## 2014-06-15 DIAGNOSIS — R944 Abnormal results of kidney function studies: Secondary | ICD-10-CM | POA: Diagnosis not present

## 2014-06-15 DIAGNOSIS — R0602 Shortness of breath: Secondary | ICD-10-CM | POA: Insufficient documentation

## 2014-06-15 DIAGNOSIS — Z792 Long term (current) use of antibiotics: Secondary | ICD-10-CM | POA: Diagnosis not present

## 2014-06-15 DIAGNOSIS — M109 Gout, unspecified: Secondary | ICD-10-CM | POA: Insufficient documentation

## 2014-06-15 DIAGNOSIS — Z8639 Personal history of other endocrine, nutritional and metabolic disease: Secondary | ICD-10-CM | POA: Insufficient documentation

## 2014-06-15 DIAGNOSIS — Z79899 Other long term (current) drug therapy: Secondary | ICD-10-CM | POA: Diagnosis not present

## 2014-06-15 LAB — CBC WITH DIFFERENTIAL/PLATELET
BASOS ABS: 0 10*3/uL (ref 0.0–0.1)
Basophils Relative: 0 % (ref 0–1)
Eosinophils Absolute: 0.1 10*3/uL (ref 0.0–0.7)
Eosinophils Relative: 1 % (ref 0–5)
HCT: 30.5 % — ABNORMAL LOW (ref 36.0–46.0)
Hemoglobin: 10.3 g/dL — ABNORMAL LOW (ref 12.0–15.0)
Lymphocytes Relative: 28 % (ref 12–46)
Lymphs Abs: 2.3 10*3/uL (ref 0.7–4.0)
MCH: 31.1 pg (ref 26.0–34.0)
MCHC: 33.8 g/dL (ref 30.0–36.0)
MCV: 92.1 fL (ref 78.0–100.0)
Monocytes Absolute: 0.6 10*3/uL (ref 0.1–1.0)
Monocytes Relative: 7 % (ref 3–12)
NEUTROS ABS: 5.2 10*3/uL (ref 1.7–7.7)
Neutrophils Relative %: 64 % (ref 43–77)
Platelets: 174 10*3/uL (ref 150–400)
RBC: 3.31 MIL/uL — ABNORMAL LOW (ref 3.87–5.11)
RDW: 14.3 % (ref 11.5–15.5)
WBC: 8.2 10*3/uL (ref 4.0–10.5)

## 2014-06-15 LAB — COMPREHENSIVE METABOLIC PANEL
ALBUMIN: 3.3 g/dL — AB (ref 3.5–5.2)
ALK PHOS: 41 U/L (ref 39–117)
ALT: 12 U/L (ref 0–35)
AST: 19 U/L (ref 0–37)
Anion gap: 6 (ref 5–15)
BILIRUBIN TOTAL: 0.4 mg/dL (ref 0.3–1.2)
BUN: 14 mg/dL (ref 6–23)
CO2: 22 mmol/L (ref 19–32)
Calcium: 8.8 mg/dL (ref 8.4–10.5)
Chloride: 103 mEq/L (ref 96–112)
Creatinine, Ser: 1.58 mg/dL — ABNORMAL HIGH (ref 0.50–1.10)
GFR calc Af Amer: 36 mL/min — ABNORMAL LOW (ref >90)
GFR calc non Af Amer: 31 mL/min — ABNORMAL LOW (ref >90)
Glucose, Bld: 140 mg/dL — ABNORMAL HIGH (ref 70–99)
POTASSIUM: 3.5 mmol/L (ref 3.5–5.1)
Sodium: 131 mmol/L — ABNORMAL LOW (ref 135–145)
Total Protein: 5.6 g/dL — ABNORMAL LOW (ref 6.0–8.3)

## 2014-06-15 LAB — D-DIMER, QUANTITATIVE (NOT AT ARMC): D DIMER QUANT: 0.64 ug{FEU}/mL — AB (ref 0.00–0.48)

## 2014-06-15 LAB — TROPONIN I: Troponin I: <0.03 ng/mL (ref <0.031)

## 2014-06-15 LAB — BRAIN NATRIURETIC PEPTIDE: B Natriuretic Peptide: 151 pg/mL — ABNORMAL HIGH (ref 0.0–100.0)

## 2014-06-15 MED ORDER — TECHNETIUM TC 99M DIETHYLENETRIAME-PENTAACETIC ACID
40.0000 | Freq: Once | INTRAVENOUS | Status: AC | PRN
  Administered 2014-06-15: 40 via RESPIRATORY_TRACT

## 2014-06-15 MED ORDER — TECHNETIUM TO 99M ALBUMIN AGGREGATED
6.0000 | Freq: Once | INTRAVENOUS | Status: AC | PRN
  Administered 2014-06-15: 5.5 via INTRAVENOUS
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
## 2014-06-15 NOTE — ED Notes (Signed)
Pt reports SHOB x 2 days with a cough.

## 2014-06-15 NOTE — ED Notes (Signed)
Informed patient that it would be around 2 hours before the test was done.

## 2014-06-15 NOTE — Discharge Instructions (Signed)
Follow up with your md next week and follow up with [facility]

## 2014-06-16 DIAGNOSIS — R944 Abnormal results of kidney function studies: Secondary | ICD-10-CM | POA: Diagnosis not present

## 2014-06-16 DIAGNOSIS — R0602 Shortness of breath: Secondary | ICD-10-CM | POA: Diagnosis not present

## 2014-06-16 NOTE — ED Provider Notes (Signed)
VQ scan negative.  Discharge per plan established by Dr. Roderic Palau.  BP 148/49 mmHg  Pulse 54  Temp(Src) 97.9 F (36.6 C) (Axillary)  Resp 12  Ht 5\' 4"  (1.626 m)  Wt 150 lb (68.04 kg)  BMI 25.73 kg/m2  SpO2 98%   Melissa Essex, MD 06/16/14 [phone]

## 2014-06-17 ENCOUNTER — Ambulatory Visit: Payer: Medicare Other | Admitting: Family Medicine

## 2014-06-17 ENCOUNTER — Telehealth: Payer: Self-pay | Admitting: Family Medicine

## 2014-06-17 NOTE — Telephone Encounter (Signed)
Patient states that she is sob and her heart is racing. Patient advised to go to the Emergency Room for evaluation of SOB and elevated heart rate.

## 2014-06-17 NOTE — Telephone Encounter (Signed)
-----   Message from Chipper Herb, MD sent at 06/14/2014  9:37 PM EST ----- Take sulfa antibiotic as directed and recheck urinalysis when completed----please make sure to find out if she is doing any better or not.

## 2014-06-17 NOTE — Telephone Encounter (Signed)
Pt aware of results and need for repeat urinalysis; Pt states she is much better

## 2014-06-25 ENCOUNTER — Telehealth: Payer: Self-pay | Admitting: Family Medicine

## 2014-06-26 NOTE — Telephone Encounter (Signed)
With chronic kidney disease and NSAID of this type would not be recommended. An alternative might be arthritis strength Tylenol or tramadol

## 2014-06-26 NOTE — Telephone Encounter (Signed)
Patient was given celebrex from a prior doctor in [place].  It worked really well for her.  Please send in a script.

## 2014-06-26 NOTE — Telephone Encounter (Signed)
She will take the tylenol arthritis pills for relief.

## 2014-06-27 ENCOUNTER — Other Ambulatory Visit (INDEPENDENT_AMBULATORY_CARE_PROVIDER_SITE_OTHER): Payer: Medicare Other

## 2014-06-27 DIAGNOSIS — N390 Urinary tract infection, site not specified: Secondary | ICD-10-CM | POA: Diagnosis not present

## 2014-06-27 LAB — POCT URINALYSIS DIPSTICK
Bilirubin, UA: NEGATIVE
Blood, UA: NEGATIVE
Glucose, UA: NEGATIVE
KETONES UA: NEGATIVE
NITRITE UA: NEGATIVE
PH UA: 7
Protein, UA: NEGATIVE
Spec Grav, UA: 1.01
Urobilinogen, UA: NEGATIVE

## 2014-06-27 LAB — POCT UA - MICROSCOPIC ONLY
Bacteria, U Microscopic: NEGATIVE
CASTS, UR, LPF, POC: NEGATIVE
Crystals, Ur, HPF, POC: NEGATIVE
Mucus, UA: NEGATIVE
RBC, urine, microscopic: NEGATIVE
YEAST UA: NEGATIVE

## 2014-06-27 NOTE — Addendum Note (Signed)
Addended by: Earlene Plater on: 06/27/2014 12:36 PM   Modules accepted: Orders

## 2014-06-29 LAB — URINE CULTURE

## 2014-07-04 ENCOUNTER — Telehealth: Payer: Self-pay | Admitting: Family Medicine

## 2014-07-04 NOTE — Telephone Encounter (Signed)
Pt given appt with dr Sabra Heck 08/08/14 at 9.

## 2014-07-17 ENCOUNTER — Ambulatory Visit: Payer: Medicare Other | Admitting: Family Medicine

## 2014-07-19 ENCOUNTER — Ambulatory Visit (INDEPENDENT_AMBULATORY_CARE_PROVIDER_SITE_OTHER): Payer: Medicare Other | Admitting: Cardiovascular Disease

## 2014-07-19 ENCOUNTER — Encounter: Payer: Self-pay | Admitting: Cardiovascular Disease

## 2014-07-19 VITALS — BP 134/80 | HR 53 | Ht 64.0 in | Wt 150.0 lb

## 2014-07-19 DIAGNOSIS — Z9289 Personal history of other medical treatment: Secondary | ICD-10-CM

## 2014-07-19 DIAGNOSIS — I447 Left bundle-branch block, unspecified: Secondary | ICD-10-CM

## 2014-07-19 DIAGNOSIS — N183 Chronic kidney disease, stage 3 (moderate): Secondary | ICD-10-CM

## 2014-07-19 DIAGNOSIS — Z87898 Personal history of other specified conditions: Secondary | ICD-10-CM | POA: Diagnosis not present

## 2014-07-19 DIAGNOSIS — I10 Essential (primary) hypertension: Secondary | ICD-10-CM | POA: Diagnosis not present

## 2014-07-19 DIAGNOSIS — R001 Bradycardia, unspecified: Secondary | ICD-10-CM | POA: Diagnosis not present

## 2014-07-19 DIAGNOSIS — I739 Peripheral vascular disease, unspecified: Secondary | ICD-10-CM | POA: Diagnosis not present

## 2014-07-19 MED ORDER — AMLODIPINE BESYLATE 5 MG PO TABS: 5.0000 mg | ORAL_TABLET | Freq: Every day | ORAL | Status: DC
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
## 2014-07-19 NOTE — Patient Instructions (Addendum)
Your physician wants you to follow-up in: 1 month with Dr.Koneswaran    STOP HCTZ  Take Atenolol (25 mg) for the next 2 days and then STOP   Tomorrow START Amlodipine 5 mg daily     Thank you for choosing [facility] !

## 2014-08-01 ENCOUNTER — Encounter (INDEPENDENT_AMBULATORY_CARE_PROVIDER_SITE_OTHER): Payer: Self-pay | Admitting: *Deleted

## 2014-08-08 ENCOUNTER — Encounter: Payer: Self-pay | Admitting: Family Medicine

## 2014-08-08 ENCOUNTER — Ambulatory Visit (INDEPENDENT_AMBULATORY_CARE_PROVIDER_SITE_OTHER): Payer: Medicare Other | Admitting: Family Medicine

## 2014-08-08 VITALS — BP 118/61 | HR 68 | Temp 97.0°F | Ht 64.0 in | Wt 154.0 lb

## 2014-08-08 DIAGNOSIS — M25361 Other instability, right knee: Secondary | ICD-10-CM | POA: Diagnosis not present

## 2014-08-08 DIAGNOSIS — N183 Chronic kidney disease, stage 3 unspecified: Secondary | ICD-10-CM

## 2014-08-08 DIAGNOSIS — I10 Essential (primary) hypertension: Secondary | ICD-10-CM

## 2014-08-08 DIAGNOSIS — Z1231 Encounter for screening mammogram for malignant neoplasm of breast: Secondary | ICD-10-CM

## 2014-08-08 NOTE — Progress Notes (Signed)
   Subjective:    Patient ID: Melissa Garrett, female    DOB: [birth year], 74 y.o.   MRN: [id number]  HPI  74 year old female here to follow-up for pressure, chronic kidney disease, and right knee pain. Since her last visit she was seen in emergency room for shortness of breath over some changes with her medication and she was placed on amlodipine as opposed to beta blocker.  Her other problem today is related to her right knee. She had a total knee replacement in 2005 and then a redo about 1 year later. Now it still feels like it's slipping or moving. She has a soft knee brace on today but her knee limits her ability to ambulate. There is no history of falls. She uses a walker wheelchair to get around. Patient Active Problem List   Diagnosis Date Noted  . CKD (chronic kidney disease) stage 3, GFR 30-59 ml/min 03/27/2014  . Acute on chronic renal failure 03/27/2014  . Nausea and vomiting 03/27/2014  . Acute diarrhea 03/27/2014  . Right iliac artery stenosis 03/27/2014  . Nausea & vomiting 03/27/2014  . Essential hypertension 03/22/2014  . Loss of weight 03/22/2014   Outpatient Encounter Prescriptions as of 08/08/2014  Medication Sig  . allopurinol (ZYLOPRIM) 100 MG tablet Take 100 mg by mouth daily.  Marland Kitchen amLODipine (NORVASC) 5 MG tablet Take 1 tablet (5 mg total) by mouth daily.  Marland Kitchen aspirin EC 81 MG tablet Take 81 mg by mouth daily.  . Capsicum, Cayenne, (CAYENNE PO) Take 1 capsule by mouth daily.  Marland Kitchen docusate sodium (COLACE) 100 MG capsule Take 100 mg by mouth 2 (two) times daily.  Marland Kitchen esomeprazole (NEXIUM) 40 MG capsule Take 40 mg by mouth daily at 12 noon.  . lactobacillus acidophilus (BACID) TABS tablet Take 1 tablet by mouth 3 (three) times daily. PROBIOTIC  . Lecithin 1200 MG CAPS Take 1 capsule by mouth daily.  . Multiple Vitamin (MULTIVITAMIN WITH MINERALS) TABS tablet Take 1 tablet by mouth daily.  . ondansetron (ZOFRAN) 4 MG tablet Take 1 tablet by mouth every 6 (six) hours as needed for  nausea or vomiting.   Marland Kitchen oxybutynin (DITROPAN) 5 MG tablet Take 1 tablet by mouth daily.   . vitamin B-12 (CYANOCOBALAMIN) 1000 MCG tablet Take 1,000 mcg by mouth daily.    Review of Systems  Constitutional: Negative.   HENT: Negative.   Eyes: Negative.   Respiratory: Negative.   Cardiovascular: Negative.   Gastrointestinal: Negative.   Endocrine: Negative.   Genitourinary: Negative.   Musculoskeletal: Positive for arthralgias.  Hematological: Negative.   Psychiatric/Behavioral: Negative.        Objective:   Physical Exam  Constitutional: She is oriented to person, place, and time. She appears well-developed.  Cardiovascular: Normal rate and regular rhythm.   Pulmonary/Chest: Effort normal.  Abdominal: Soft.  Neurological: She is alert and oriented to person, place, and time.    BP 118/61 mmHg  Pulse 68  Temp(Src) 97 F (36.1 C)  Ht 5\' 4"  (1.626 m)  Wt 154 lb (69.854 kg)  BMI 26.42 kg/m2        Assessment & Plan:  1. Essential hypertension  BP controlled  2. CKD (chronic kidney disease) stage 3, GFR 30-59 ml/min Will recheck kidney functions at next visy in 4 months  3. Visit for screening mammogram  - MM DIGITAL SCREENING BILATERAL; Future  Wardell Honour MD

## 2014-08-14 ENCOUNTER — Ambulatory Visit (HOSPITAL_COMMUNITY)
Admission: RE | Admit: 2014-08-14 | Discharge: 2014-08-14 | Disposition: A | Discharge status: Home / Self Care | Payer: Medicare Other | Source: Ambulatory Visit | Attending: Family Medicine | Admitting: Family Medicine

## 2014-08-14 DIAGNOSIS — Z1231 Encounter for screening mammogram for malignant neoplasm of breast: Secondary | ICD-10-CM

## 2014-08-23 ENCOUNTER — Ambulatory Visit: Payer: Medicare Other | Admitting: Cardiovascular Disease

## 2014-09-03 ENCOUNTER — Ambulatory Visit: Payer: Medicare Other | Admitting: Cardiovascular Disease

## 2014-09-25 DIAGNOSIS — T84012A Broken internal right knee prosthesis, initial encounter: Secondary | ICD-10-CM | POA: Diagnosis not present

## 2014-09-27 NOTE — Patient Instructions (Addendum)
Your procedure is scheduled on: 10/08/14  TUESDAY  Report to [facility] at    [place]   AM.   Call this number if you have problems the morning of surgery: [phone]        Do not eat food  Or drink :After Midnight. Monday NIGHT   Take these medicines the morning of surgery with A SIP OF WATER: Amlodipine   .  Contacts, dentures or partial plates, or metal hairpins  can not be worn to surgery. Your family will be responsible for glasses, dentures, hearing aides while you are in surgery  Leave suitcase in the car. After surgery it may be brought to your room.  For patients admitted to the hospital, checkout time is 11:00 AM day of  discharge.         [facility] IS NOT RESPONSIBLE FOR ANY VALUABLES  Patients discharged the day of surgery will not be allowed to drive home. IF going home the day of surgery, you must have a driver and someone to stay with you for the first 24 hours                                                                  [facility] - Preparing for Surgery Before surgery, you can play an important role.  Because skin is not sterile, your skin needs to be as free of germs as possible.  You can reduce the number of germs on your skin by washing with CHG (chlorahexidine gluconate) soap before surgery.  CHG is an antiseptic cleaner which kills germs and bonds with the skin to continue killing germs even after washing. Please DO NOT use if you have an allergy to CHG or antibacterial soaps.  If your skin becomes reddened/irritated stop using the CHG and inform your nurse when you arrive at Short Stay. Do not shave (including legs and underarms) for at least 48 hours prior to the first CHG shower.  You may shave your face/neck. Please follow these instructions carefully:  1.  Shower with CHG Soap the night before surgery and the  morning of Surgery.  2.  If you choose to wash your hair, wash your  hair first as usual with your  normal  shampoo.  3.  After you shampoo, rinse your hair and body thoroughly to remove the  shampoo.                           4.  Use CHG as you would any other liquid soap.  You can apply chg directly  to the skin and wash                       Gently with a scrungie or clean washcloth.  5.  Apply the CHG Soap to your body ONLY FROM THE NECK DOWN.   Do not use on face/ open                           Wound or open sores. Avoid contact with eyes, ears mouth and genitals (private parts).  Wash face,  Genitals (private parts) with your normal soap.             6.  Wash thoroughly, paying special attention to the area where your surgery  will be performed.  7.  Thoroughly rinse your body with warm water from the neck down.  8.  DO NOT shower/wash with your normal soap after using and rinsing off  the CHG Soap.                9.  Pat yourself dry with a clean towel.            10.  Wear clean pajamas.            11.  Place clean sheets on your bed the night of your first shower and do not  sleep with pets. Day of Surgery : Do not apply any lotions/deodorants the morning of surgery.  Please wear clean clothes to the hospital/surgery center.  FAILURE TO FOLLOW THESE INSTRUCTIONS MAY RESULT IN THE CANCELLATION OF YOUR SURGERY PATIENT SIGNATURE_________________________________  NURSE SIGNATURE__________________________________  ________________________________________________________________________

## 2014-09-27 NOTE — Progress Notes (Signed)
Chest x ray, ekg 12/15 epic lov dr Sabra Heck 2/16 epic lov dr Bronson Ing 1/16  epic

## 2014-09-27 NOTE — Progress Notes (Signed)
Please put orders in Epic surgery 10-08-14 pre op 09-30-14 Thanks

## 2014-09-30 ENCOUNTER — Ambulatory Visit (INDEPENDENT_AMBULATORY_CARE_PROVIDER_SITE_OTHER): Payer: Medicare Other | Admitting: Internal Medicine

## 2014-09-30 ENCOUNTER — Ambulatory Visit: Payer: Self-pay | Admitting: Orthopedic Surgery

## 2014-09-30 ENCOUNTER — Encounter (HOSPITAL_COMMUNITY)
Admission: RE | Admit: 2014-09-30 | Discharge: 2014-09-30 | Disposition: A | Discharge status: Home / Self Care | Payer: Commercial Managed Care - HMO | Source: Ambulatory Visit | Attending: Orthopedic Surgery | Admitting: Orthopedic Surgery

## 2014-09-30 ENCOUNTER — Encounter (HOSPITAL_COMMUNITY): Payer: Self-pay

## 2014-09-30 ENCOUNTER — Telehealth: Payer: Self-pay | Admitting: Family Medicine

## 2014-09-30 DIAGNOSIS — Z01818 Encounter for other preprocedural examination: Secondary | ICD-10-CM | POA: Diagnosis not present

## 2014-09-30 DIAGNOSIS — T84012D Broken internal right knee prosthesis, subsequent encounter: Secondary | ICD-10-CM | POA: Diagnosis not present

## 2014-09-30 HISTORY — DX: Personal history of other medical treatment: Z92.89

## 2014-09-30 HISTORY — DX: Unspecified osteoarthritis, unspecified site: M19.90

## 2014-09-30 LAB — URINALYSIS, ROUTINE W REFLEX MICROSCOPIC
Bilirubin Urine: NEGATIVE
GLUCOSE, UA: NEGATIVE mg/dL
Hgb urine dipstick: NEGATIVE
KETONES UR: NEGATIVE mg/dL
NITRITE: NEGATIVE
Protein, ur: NEGATIVE mg/dL
Specific Gravity, Urine: 1.018 (ref 1.005–1.030)
Urobilinogen, UA: 0.2 mg/dL (ref 0.0–1.0)
pH: 5.5 (ref 5.0–8.0)

## 2014-09-30 LAB — COMPREHENSIVE METABOLIC PANEL
ALBUMIN: 4 g/dL (ref 3.5–5.2)
ALK PHOS: 54 U/L (ref 39–117)
ALT: 14 U/L (ref 0–35)
ANION GAP: 11 (ref 5–15)
AST: 22 U/L (ref 0–37)
BILIRUBIN TOTAL: 0.2 mg/dL — AB (ref 0.3–1.2)
BUN: 19 mg/dL (ref 6–23)
CHLORIDE: 105 mmol/L (ref 96–112)
CO2: 26 mmol/L (ref 19–32)
Calcium: 9.6 mg/dL (ref 8.4–10.5)
Creatinine, Ser: 1.3 mg/dL — ABNORMAL HIGH (ref 0.50–1.10)
GFR calc Af Amer: 46 mL/min — ABNORMAL LOW (ref >90)
GFR, EST NON AFRICAN AMERICAN: 39 mL/min — AB (ref >90)
GLUCOSE: 115 mg/dL — AB (ref 70–99)
POTASSIUM: 4.1 mmol/L (ref 3.5–5.1)
Sodium: 142 mmol/L (ref 135–145)
Total Protein: 6.9 g/dL (ref 6.0–8.3)

## 2014-09-30 LAB — CBC
HCT: 38.4 % (ref 36.0–46.0)
Hemoglobin: 12.3 g/dL (ref 12.0–15.0)
MCH: 29.3 pg (ref 26.0–34.0)
MCHC: 32 g/dL (ref 30.0–36.0)
MCV: 91.4 fL (ref 78.0–100.0)
Platelets: 162 10*3/uL (ref 150–400)
RBC: 4.2 MIL/uL (ref 3.87–5.11)
RDW: 14.2 % (ref 11.5–15.5)
WBC: 6.4 10*3/uL (ref 4.0–10.5)

## 2014-09-30 LAB — URINE MICROSCOPIC-ADD ON

## 2014-09-30 LAB — SURGICAL PCR SCREEN
MRSA, PCR: NEGATIVE
Staphylococcus aureus: NEGATIVE

## 2014-09-30 LAB — PROTIME-INR
INR: 0.94 (ref 0.00–1.49)
PROTHROMBIN TIME: 12.7 s (ref 11.6–15.2)

## 2014-09-30 LAB — APTT: APTT: 29 s (ref 24–37)

## 2014-09-30 LAB — ABO/RH: ABO/RH(D): A POS

## 2014-09-30 MED ORDER — ACETAMINOPHEN 10 MG/ML IV SOLN: 1000.0000 mg | Freq: Once | INTRAVENOUS | Status: AC

## 2014-09-30 MED ORDER — SODIUM CHLORIDE 0.9 % IV SOLN: 4.0000 mg | Freq: Once | INTRAVENOUS | Status: AC

## 2014-09-30 MED ORDER — DEXAMETHASONE SODIUM PHOSPHATE 4 MG/ML IJ SOLN
4.0000 mg | Freq: Once | INTRAMUSCULAR | Status: AC
  Administered 2014-10-08: 8 mg via INTRAVENOUS

## 2014-09-30 NOTE — Progress Notes (Signed)
Faxed urine with micro to Dr Lyla Glassing via EPIC.   Spoke with Andee Poles, schedular at Dr Sherrie George office regarding cardio clearance. States she has not received this yet. Informed her that pt stated to me she has appt Dr Bronson Ing 10/04/14
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
## 2014-10-01 ENCOUNTER — Ambulatory Visit (INDEPENDENT_AMBULATORY_CARE_PROVIDER_SITE_OTHER): Payer: Medicare Other | Admitting: Internal Medicine

## 2014-10-04 ENCOUNTER — Encounter: Payer: Self-pay | Admitting: Cardiovascular Disease

## 2014-10-04 ENCOUNTER — Ambulatory Visit: Payer: Medicare Other | Admitting: Cardiovascular Disease

## 2014-10-04 ENCOUNTER — Ambulatory Visit (INDEPENDENT_AMBULATORY_CARE_PROVIDER_SITE_OTHER): Payer: Commercial Managed Care - HMO | Admitting: Cardiovascular Disease

## 2014-10-04 VITALS — BP 164/66 | HR 64 | Ht 64.0 in | Wt 148.0 lb

## 2014-10-04 DIAGNOSIS — Z01818 Encounter for other preprocedural examination: Secondary | ICD-10-CM

## 2014-10-04 DIAGNOSIS — N183 Chronic kidney disease, stage 3 (moderate): Secondary | ICD-10-CM

## 2014-10-04 DIAGNOSIS — R001 Bradycardia, unspecified: Secondary | ICD-10-CM

## 2014-10-04 DIAGNOSIS — I739 Peripheral vascular disease, unspecified: Secondary | ICD-10-CM

## 2014-10-04 DIAGNOSIS — I10 Essential (primary) hypertension: Secondary | ICD-10-CM | POA: Diagnosis not present

## 2014-10-04 DIAGNOSIS — I447 Left bundle-branch block, unspecified: Secondary | ICD-10-CM

## 2014-10-04 MED ORDER — AMLODIPINE BESYLATE 10 MG PO TABS: 10.0000 mg | ORAL_TABLET | Freq: Every day | ORAL | Status: DC
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
## 2014-10-04 NOTE — Patient Instructions (Signed)
Your physician recommends that you schedule a follow-up appointment in: As Needed   Your physician has recommended you make the following change in your medication:   Increase Amlodipine to 10mg  Daily     Thank you for choosing [facility]!

## 2014-10-04 NOTE — Addendum Note (Signed)
Addended by: Levonne Hubert on: 10/04/2014 04:22 PM   Modules accepted: Orders, Level of Service

## 2014-10-06 ENCOUNTER — Emergency Department (HOSPITAL_COMMUNITY): Payer: Commercial Managed Care - HMO

## 2014-10-06 ENCOUNTER — Encounter (HOSPITAL_COMMUNITY): Payer: Self-pay | Admitting: Emergency Medicine

## 2014-10-06 ENCOUNTER — Emergency Department (HOSPITAL_COMMUNITY)
Admission: EM | Admit: 2014-10-06 | Discharge: 2014-10-06 | Disposition: A | Discharge status: Home / Self Care | Payer: Commercial Managed Care - HMO | Source: Home / Self Care | Attending: Emergency Medicine | Admitting: Emergency Medicine

## 2014-10-06 DIAGNOSIS — K219 Gastro-esophageal reflux disease without esophagitis: Secondary | ICD-10-CM | POA: Diagnosis present

## 2014-10-06 DIAGNOSIS — I10 Essential (primary) hypertension: Secondary | ICD-10-CM | POA: Insufficient documentation

## 2014-10-06 DIAGNOSIS — M1990 Unspecified osteoarthritis, unspecified site: Secondary | ICD-10-CM

## 2014-10-06 DIAGNOSIS — Z792 Long term (current) use of antibiotics: Secondary | ICD-10-CM | POA: Insufficient documentation

## 2014-10-06 DIAGNOSIS — I129 Hypertensive chronic kidney disease with stage 1 through stage 4 chronic kidney disease, or unspecified chronic kidney disease: Secondary | ICD-10-CM | POA: Diagnosis present

## 2014-10-06 DIAGNOSIS — Z01812 Encounter for preprocedural laboratory examination: Secondary | ICD-10-CM | POA: Diagnosis not present

## 2014-10-06 DIAGNOSIS — I739 Peripheral vascular disease, unspecified: Secondary | ICD-10-CM | POA: Diagnosis present

## 2014-10-06 DIAGNOSIS — G8929 Other chronic pain: Secondary | ICD-10-CM | POA: Insufficient documentation

## 2014-10-06 DIAGNOSIS — Z8639 Personal history of other endocrine, nutritional and metabolic disease: Secondary | ICD-10-CM | POA: Insufficient documentation

## 2014-10-06 DIAGNOSIS — M5030 Other cervical disc degeneration, unspecified cervical region: Secondary | ICD-10-CM

## 2014-10-06 DIAGNOSIS — Z471 Aftercare following joint replacement surgery: Secondary | ICD-10-CM | POA: Diagnosis not present

## 2014-10-06 DIAGNOSIS — N183 Chronic kidney disease, stage 3 (moderate): Secondary | ICD-10-CM | POA: Diagnosis present

## 2014-10-06 DIAGNOSIS — Y792 Prosthetic and other implants, materials and accessory orthopedic devices associated with adverse incidents: Secondary | ICD-10-CM | POA: Diagnosis present

## 2014-10-06 DIAGNOSIS — T84092A Other mechanical complication of internal right knee prosthesis, initial encounter: Principal | ICD-10-CM | POA: Diagnosis present

## 2014-10-06 DIAGNOSIS — M109 Gout, unspecified: Secondary | ICD-10-CM

## 2014-10-06 DIAGNOSIS — M25561 Pain in right knee: Secondary | ICD-10-CM | POA: Diagnosis present

## 2014-10-06 DIAGNOSIS — Z96651 Presence of right artificial knee joint: Secondary | ICD-10-CM | POA: Diagnosis not present

## 2014-10-06 DIAGNOSIS — T84022A Instability of internal right knee prosthesis, initial encounter: Secondary | ICD-10-CM | POA: Diagnosis not present

## 2014-10-06 DIAGNOSIS — Z79899 Other long term (current) drug therapy: Secondary | ICD-10-CM | POA: Insufficient documentation

## 2014-10-06 DIAGNOSIS — M5090 Cervical disc disorder, unspecified, unspecified cervical region: Secondary | ICD-10-CM | POA: Insufficient documentation

## 2014-10-06 DIAGNOSIS — Z7982 Long term (current) use of aspirin: Secondary | ICD-10-CM

## 2014-10-06 DIAGNOSIS — M542 Cervicalgia: Secondary | ICD-10-CM | POA: Insufficient documentation

## 2014-10-06 DIAGNOSIS — M47812 Spondylosis without myelopathy or radiculopathy, cervical region: Secondary | ICD-10-CM | POA: Diagnosis not present

## 2014-10-06 DIAGNOSIS — R51 Headache: Secondary | ICD-10-CM | POA: Diagnosis not present

## 2014-10-06 HISTORY — DX: Nausea: R11.0

## 2014-10-06 HISTORY — DX: Cervicalgia: M54.2

## 2014-10-06 HISTORY — DX: Other cervical disc degeneration, unspecified cervical region: M50.30

## 2014-10-06 HISTORY — DX: Other chronic pain: G89.29

## 2014-10-06 HISTORY — DX: Functional dyspepsia: K30

## 2014-10-06 MED ORDER — ACETAMINOPHEN 500 MG PO TABS
1000.0000 mg | ORAL_TABLET | Freq: Once | ORAL | Status: AC
  Administered 2014-10-06: 1000 mg via ORAL

## 2014-10-06 MED ORDER — ACETAMINOPHEN 500 MG PO TABS
ORAL_TABLET | ORAL | Status: AC
  Filled 2014-10-06: qty 2

## 2014-10-06 MED ORDER — METHOCARBAMOL 500 MG PO TABS: 500.0000 mg | ORAL_TABLET | Freq: Two times a day (BID) | ORAL | Status: DC | PRN

## 2014-10-06 NOTE — ED Notes (Signed)
Patient with no complaints at this time. Respirations even and unlabored. Skin warm/dry. Discharge instructions reviewed with patient at this time. Patient given opportunity to voice concerns/ask questions. Patient discharged at this time and left Emergency Department with steady gait.   

## 2014-10-06 NOTE — Discharge Instructions (Signed)
°Emergency Department Resource Guide °1) Find a Doctor and Pay Out of Pocket °Although you won't have to find out who is covered by your insurance plan, it is a good idea to ask around and get recommendations. You will then need to call the office and see if the doctor you have chosen will accept you as a new patient and what types of options they offer for patients who are self-pay. Some doctors offer discounts or will set up payment plans for their patients who do not have insurance, but you will need to ask so you aren't surprised when you get to your appointment. ° °2) Contact Your Local Health Department °Not all health departments have doctors that can see patients for sick visits, but many do, so it is worth a call to see if yours does. If you don't know where your local health department is, you can check in your phone book. The CDC also has a tool to help you locate your state's health department, and many state websites also have listings of all of their local health departments. ° °3) Find a Walk-in Clinic °If your illness is not likely to be very severe or complicated, you may want to try a walk in clinic. These are popping up all over the country in pharmacies, drugstores, and shopping centers. They're usually staffed by nurse practitioners or physician assistants that have been trained to treat common illnesses and complaints. They're usually fairly quick and inexpensive. However, if you have serious medical issues or chronic medical problems, these are probably not your best option. ° °No Primary Care Doctor: °- Call [facility] Connect at  832-8000 - they can help you locate a primary care doctor that  accepts your insurance, provides certain services, etc. °- Physician Referral Service- 1-800-533-3463 ° °Chronic Pain Problems: °Organization         Address  Phone   Notes  °[facility]  (336) 297-2271 Patients need to be referred by their primary care doctor.  ° °Medication  Assistance: °Organization         Address  Phone   Notes  °[place] [facility] [address] °[place], [place] [address] (336) 641-8030 --Must be a resident of [place] °-- Must have NO insurance coverage whatsoever (no Medicaid/ Medicare, etc.) °-- The pt. MUST have a primary care doctor that directs their care regularly and follows them in the community °  °[facility]  (866) 331-1348   °[employer]  (888) 892-1162   ° °Agencies that provide inexpensive medical care: °Organization         Address  Phone   Notes  °[facility]  (336) 832-8035   °[facility]    (336) 832-7272   °[facility] [address] °[place], [place] [address] (336) 832-4777   °[facility] [address], °[place] (336) 271-4999   °[facility]    (336) 373-0678   °[facility]    (336) 272-1050   °[facility] ° [address], [place] Phone:  (336) 832-4444, Fax:  (336) 832-4440 Hours of Operation:  9 am - 6 pm, M-F.  Also accepts Medicaid/Medicare and self-pay.  °[facility] ° [address], [place] Phone: (336) 832-3150, Fax: (336) [phone]. Hours of Operation:  8:30 am - 5:30 pm, M-F.  Also accepts Medicaid and self-pay.  °[facility] [place] [address], [place] Phone: (336) 878-6027   °[facility] [address], [place], [place] (336)723-1848, Ext. 123 Mondays & Thursdays: 7-9 AM.  First 15 patients are seen on a first come, first serve basis. °  ° °Medicaid-accepting [place] Providers: ° °Organization         Address  Phone   Notes  °[facility] [address], [place] (336) 641-2100 Also accepts self-pay patients.  °[facility] [address], [place] ° (336) 856-9996   °[facility] [address], [place]  (336) 288-8857   °[facility] [address] [place] [address], [place] (336) 299-7000   °Veita Bland [address], [place]  ° (336) 373-1557 Only accepts [place] Access Medicaid patients after they have their name applied to their card.  ° °Self-Pay (no insurance) in [place]: ° °Organization         Address  Phone   Notes  °Sickle Cell Patients, [facility] [address], [place] (336) 832-1970   °[facility] [address], [place] (336) 832-4400   °[facility] ° [address], [place] (336) 992-4800   °[facility]/Dr. Osei-Bonsu ° [address] [place] [address], [place] or [address], [place] (336) 841-8500 Phone number for both [place] and [place] locations is the same.  °[facility] [address], [place] (336) 299-0000   °[facility] [address] [place] [address], [place] or [address] (336) 852-7530 °(336) 878-2260   °[facility] [address], [place] (336) 350-1642, phone; (336) 294-5005, fax Sees patients 1st and 3rd Saturday of every month.  Must not qualify for public or private insurance (i.e. Medicaid, Medicare, [place] Health Choice, Veterans' Benefits) • Household income should be no more than 200% of the poverty level •The clinic cannot treat you if you are pregnant or think you are pregnant • Sexually transmitted diseases are not treated at the clinic.  ° ° °Dental Care: °Organization         Address  Phone  Notes  °[place] [facility] [address], [place] (336) 641-6152 Accepts children up to age 21 who are enrolled in Medicaid or [place] Health Choice; pregnant women with a Medicaid card; and children who have applied for Medicaid or [place] Health Choice, but were declined, whose parents can pay a reduced fee at time of service.  °[place]  [facility] [place]  [address], [place] (336) 641-7733 Accepts children up to age 21 who are enrolled in Medicaid or [place] Health Choice; pregnant women with a Medicaid card; and children who have applied for Medicaid or [place] Health Choice, but were declined, whose parents can pay a reduced fee at time of service.  °[facility] ° [address], [place] (336) 641-4533 Patients are seen by appointment only. Walk-ins are not accepted. [facility] will see patients 18 years of age and older. °Monday - Tuesday (8am-5pm) °Most Wednesdays (8:30-5pm) °$30 per visit, cash only  °[facility] ° [address], [place] (336) 641-4533 Patients are seen by appointment only. Walk-ins are not accepted. [facility] will see patients 18 years of age and older. °One   Wednesday Evening (Monthly: Volunteer Based).  $30 per visit, cash only  °[facility]  (919) 537-3737 for adults; Children under age 4, call Graduate Pediatric Dentistry at (919) [phone]. Children aged 4-14, please call (919) [phone] to request a pediatric application. ° Dental services are provided in all areas of dental care including fillings, crowns and bridges, complete and partial dentures, implants, gum treatment, root canals, and extractions. Preventive care is also provided. Treatment is provided to both adults and children. °Patients are selected via a lottery and there is often a waiting list. °  °[facility] [address], °[place] ° (336) 763-8833 [email or web address] °  °[facility] [address], [place], [place] (336)723-1848, Ext. 123 Second and Fourth Thursday of each month, opens at 6:30 AM; Clinic ends at 9 AM.  Patients are seen on a first-come first-served basis, and a limited number are seen during each clinic.  ° °[facility] ° [address], [place], [place] (336) [phone]    Eligibility Requirements °You must have lived in [place], [place], or [place] counties for at least the last three months. °  You cannot be eligible for state or federal sponsored healthcare insurance, including Veterans Administration, Medicaid, or Medicare. °  You generally cannot be eligible for healthcare insurance through your employer.  °  How to apply: °Eligibility screenings are held every Tuesday and Wednesday afternoon from 1:00 pm until 4:00 pm. You do not need an appointment for the interview!  °[facility] [address], [place], [place] 336-631-2330   °[facility]  336-342-8273   °[place] [facility]  336-703-3100   °[facility]  336-570-6415   ° °Behavioral Health Resources in the Community: °Intensive Outpatient Programs °Organization         Address  Phone  Notes  °[facility] [address], [place], [place] 336-878-6098   ° [facility] [address], [place], [place] 336-832-9800   °[facility] [address], [place], [place] ° 336-882-2125   °[facility] [address],  °[place], [place] 1-800-853-5163 or 336-641-4981   °Substance Abuse Resources °Organization         Address  Phone  Notes  °[facility]  336-882-2125   °[facility]  336-784-9470   °[facility]  336-285-9073   °[facility]  336-845-3988   °Residential & Outpatient Substance Abuse Program  1-800-659-3381   °Psychological Services °Organization         Address  Phone  Notes  ° Health  336- 832-9600   °[facility]  336- 378-7881   °[facility] [address], [place] 1-800-853-5163 or 336-641-4981   ° °Mobile Crisis Teams °Organization         Address  Phone  Notes  °[facility], Mobile Crisis Care Unit  1-877-626-1772   °Assertive °[facility] ° [address]  [place], [place] 336-834-9664   °Sharon DeEsch [address] °[place] 336-554-5454   ° °Self-Help/Support Groups °Organization         Address  Phone             Notes  °[facility]  - variety of support groups  336- 373-1402 Call for more information  °Narcotics Anonymous (NA), [facility] [address], °[place]   2 meetings at this location  ° °  Residential Treatment Programs Organization         Address  Phone  Notes  [facility] [address]    [place]  1-518-737-1778   [facility]  [address] [place] [address], [place], [place]   [place], [place] [phone] Admissions: 8am-3pm M-F  [facility] [address]    [place], [place] [phone]   [facility] [address] [place], [place], [place]   [facility] [address]  [place], [place]   [facility] - Intensive Outpatient [address], [place], [place]   [facility] ([place]  [place], [place] 1-(870) 264-8466 or [phone]   [facility] [address] [place], [place] Accepts Medicaid  [facility] [address],  [place] 1-562-482-4501 Substance Abuse/Addiction Treatment   [facility]         Address  Phone  Notes  [facility]  (336)779-5319   Domenic Schwab, PhD [address], [place]   (434)319-3817 or (301)428-5228   [facility] [place], [place] 319-774-2491   [facility] [address], [place], [place] (754)[phone] Insurance/Medicaid/sponsorship through [facility] [address] [place]                                    [place], [place] [phone] [place] [address], [place] [phone]    Dr. Adele Schilder  [phone]   [facility] Dept. 1) [address], [place] 2) [place] 3)  [place] [address], [place] [phone]  [phone]   [place] [phone] or [phone] (After Hours)      Take the prescription as directed. May also take over the counter tylenol, as directed on packaging, as needed for discomfort. Apply moist heat or ice to the area(s) of discomfort, for 15 minutes at a time, several times per day for the next few days.  Do not fall asleep on a heating or ice pack.  Call your regular medical doctor tomorrow morning to schedule a follow up appointment in the next 2 to 3 days.  Return to the Emergency Department immediately if worsening.
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
## 2014-10-06 NOTE — ED Notes (Signed)
Pt reports neck pain that radiates into the occipital region of her head x several weeks but worse today. Pt denies vision changes, increased weakness or any other neurological changes at this time. Pt states pain is worse when she applies pressure to her head.

## 2014-10-07 ENCOUNTER — Other Ambulatory Visit: Payer: Self-pay | Admitting: *Deleted

## 2014-10-07 MED ORDER — ESOMEPRAZOLE MAGNESIUM 40 MG PO CPDR: 40.0000 mg | DELAYED_RELEASE_CAPSULE | Freq: Every day | ORAL | Status: DC

## 2014-10-07 MED ORDER — ALLOPURINOL 100 MG PO TABS: 100.0000 mg | ORAL_TABLET | Freq: Every day | ORAL | Status: DC

## 2014-10-07 MED ORDER — ONDANSETRON HCL 4 MG PO TABS: 4.0000 mg | ORAL_TABLET | Freq: Four times a day (QID) | ORAL | Status: DC | PRN

## 2014-10-07 NOTE — Progress Notes (Signed)
Cardiac clearance dr branch on chart.  Ov dr Bronson Ing 10/04/14 epic

## 2014-10-07 NOTE — Progress Notes (Signed)
Patient ID: Melissa Garrett, female   DOB: Feb 15, 1941, 74 y.o.   MRN: [id number] Patient's chart reviewed in Dr Court Joy absence. She was seen 10/04/14 for preop evaluation for knee surgery. From Dr Court Joy notes thought likely to be a low to intermediate risk for the procedure, however the report of a recent stress test from [facility] was requested prior to finalizing evaluation. [place] Lexiscan from 02/2014 obtained and is negative for ischemia. Based on Dr Court Joy absence and his original documentation I will complete the neccesary paperwork for her surgery tomorrow.   Zandra Abts MD

## 2014-10-08 ENCOUNTER — Inpatient Hospital Stay (HOSPITAL_COMMUNITY): Payer: Commercial Managed Care - HMO

## 2014-10-08 ENCOUNTER — Encounter (HOSPITAL_COMMUNITY): Payer: Self-pay | Admitting: *Deleted

## 2014-10-08 ENCOUNTER — Inpatient Hospital Stay (HOSPITAL_COMMUNITY)
Admission: RE | Admit: 2014-10-08 | Discharge: 2014-10-09 | DRG: 468 | Disposition: A | Discharge status: Home Health | Payer: Commercial Managed Care - HMO | Source: Ambulatory Visit | Attending: Orthopedic Surgery | Admitting: Orthopedic Surgery

## 2014-10-08 ENCOUNTER — Inpatient Hospital Stay (HOSPITAL_COMMUNITY): Payer: Commercial Managed Care - HMO | Admitting: Anesthesiology

## 2014-10-08 ENCOUNTER — Encounter (HOSPITAL_COMMUNITY)
Admission: RE | Disposition: A | Discharge status: Home Health | Payer: Self-pay | Source: Ambulatory Visit | Attending: Orthopedic Surgery

## 2014-10-08 DIAGNOSIS — Y792 Prosthetic and other implants, materials and accessory orthopedic devices associated with adverse incidents: Secondary | ICD-10-CM | POA: Diagnosis present

## 2014-10-08 DIAGNOSIS — Z09 Encounter for follow-up examination after completed treatment for conditions other than malignant neoplasm: Secondary | ICD-10-CM

## 2014-10-08 DIAGNOSIS — N183 Chronic kidney disease, stage 3 (moderate): Secondary | ICD-10-CM | POA: Diagnosis present

## 2014-10-08 DIAGNOSIS — Z01812 Encounter for preprocedural laboratory examination: Secondary | ICD-10-CM | POA: Diagnosis not present

## 2014-10-08 DIAGNOSIS — I129 Hypertensive chronic kidney disease with stage 1 through stage 4 chronic kidney disease, or unspecified chronic kidney disease: Secondary | ICD-10-CM | POA: Diagnosis present

## 2014-10-08 DIAGNOSIS — T84092A Other mechanical complication of internal right knee prosthesis, initial encounter: Secondary | ICD-10-CM | POA: Diagnosis present

## 2014-10-08 DIAGNOSIS — I739 Peripheral vascular disease, unspecified: Secondary | ICD-10-CM | POA: Diagnosis present

## 2014-10-08 DIAGNOSIS — K219 Gastro-esophageal reflux disease without esophagitis: Secondary | ICD-10-CM | POA: Diagnosis present

## 2014-10-08 DIAGNOSIS — M25561 Pain in right knee: Secondary | ICD-10-CM | POA: Diagnosis present

## 2014-10-08 DIAGNOSIS — T84012A Broken internal right knee prosthesis, initial encounter: Secondary | ICD-10-CM

## 2014-10-08 HISTORY — PX: TOTAL KNEE ARTHROPLASTY WITH REVISION COMPONENTS: SHX6198

## 2014-10-08 LAB — TYPE AND SCREEN
ABO/RH(D): A POS
Antibody Screen: NEGATIVE

## 2014-10-08 SURGERY — TOTAL KNEE ARTHROPLASTY WITH REVISION COMPONENTS
Anesthesia: Spinal | Site: Knee | Laterality: Right

## 2014-10-08 MED ORDER — KETOROLAC TROMETHAMINE 30 MG/ML IJ SOLN
INTRAMUSCULAR | Status: DC | PRN
  Administered 2014-10-08: 30 mg

## 2014-10-08 MED ORDER — DEXAMETHASONE SODIUM PHOSPHATE 10 MG/ML IJ SOLN
INTRAMUSCULAR | Status: AC
  Filled 2014-10-08: qty 1

## 2014-10-08 MED ORDER — ALLOPURINOL 100 MG PO TABS
100.0000 mg | ORAL_TABLET | Freq: Every day | ORAL | Status: DC
  Administered 2014-10-08 – 2014-10-09 (×2): 100 mg via ORAL
  Filled 2014-10-08 (×2): qty 1

## 2014-10-08 MED ORDER — ACETAMINOPHEN 650 MG RE SUPP: 650.0000 mg | Freq: Four times a day (QID) | RECTAL | Status: DC | PRN

## 2014-10-08 MED ORDER — PROPOFOL 10 MG/ML IV BOLUS
INTRAVENOUS | Status: AC
  Filled 2014-10-08: qty 20

## 2014-10-08 MED ORDER — HYDROMORPHONE HCL 1 MG/ML IJ SOLN: 0.5000 mg | INTRAMUSCULAR | Status: DC | PRN

## 2014-10-08 MED ORDER — HYDROGEN PEROXIDE 3 % EX SOLN
CUTANEOUS | Status: AC
  Filled 2014-10-08: qty 473

## 2014-10-08 MED ORDER — CEFAZOLIN SODIUM-DEXTROSE 2-3 GM-% IV SOLR
2.0000 g | INTRAVENOUS | Status: AC
  Administered 2014-10-08: 2 g via INTRAVENOUS

## 2014-10-08 MED ORDER — FENTANYL CITRATE (PF) 250 MCG/5ML IJ SOLN
INTRAMUSCULAR | Status: AC
  Filled 2014-10-08: qty 5

## 2014-10-08 MED ORDER — SODIUM CHLORIDE 0.9 % IV SOLN
INTRAVENOUS | Status: DC
  Administered 2014-10-08: 10:00:00 via INTRAVENOUS

## 2014-10-08 MED ORDER — MENTHOL 3 MG MT LOZG: 1.0000 | LOZENGE | OROMUCOSAL | Status: DC | PRN

## 2014-10-08 MED ORDER — BOOST / RESOURCE BREEZE PO LIQD
1.0000 | Freq: Three times a day (TID) | ORAL | Status: DC
  Administered 2014-10-08: 1 via ORAL

## 2014-10-08 MED ORDER — LACTATED RINGERS IV SOLN
INTRAVENOUS | Status: DC | PRN
  Administered 2014-10-08: 07:00:00 via INTRAVENOUS

## 2014-10-08 MED ORDER — BUPIVACAINE IN DEXTROSE 0.75-8.25 % IT SOLN
INTRATHECAL | Status: DC | PRN
  Administered 2014-10-08: 2 mL via INTRATHECAL

## 2014-10-08 MED ORDER — SODIUM CHLORIDE 0.9 % IV SOLN
INTRAVENOUS | Status: DC
  Administered 2014-10-08 – 2014-10-09 (×2): via INTRAVENOUS

## 2014-10-08 MED ORDER — HYDROCODONE-ACETAMINOPHEN 5-325 MG PO TABS: 1.0000 | ORAL_TABLET | ORAL | Status: DC | PRN

## 2014-10-08 MED ORDER — HYDROGEN PEROXIDE 3 % EX SOLN
CUTANEOUS | Status: DC | PRN
  Administered 2014-10-08: 1

## 2014-10-08 MED ORDER — KETOROLAC TROMETHAMINE 30 MG/ML IJ SOLN
INTRAMUSCULAR | Status: AC
  Filled 2014-10-08: qty 1

## 2014-10-08 MED ORDER — ACETAMINOPHEN 325 MG PO TABS: 650.0000 mg | ORAL_TABLET | Freq: Four times a day (QID) | ORAL | Status: DC | PRN

## 2014-10-08 MED ORDER — RISAQUAD PO CAPS
1.0000 | ORAL_CAPSULE | Freq: Every day | ORAL | Status: DC
  Administered 2014-10-09: 1 via ORAL
  Filled 2014-10-08 (×2): qty 1

## 2014-10-08 MED ORDER — ACETAMINOPHEN 10 MG/ML IV SOLN
1000.0000 mg | Freq: Once | INTRAVENOUS | Status: AC
  Administered 2014-10-08: 1000 mg via INTRAVENOUS
  Filled 2014-10-08: qty 100

## 2014-10-08 MED ORDER — METHOCARBAMOL 500 MG PO TABS: 500.0000 mg | ORAL_TABLET | Freq: Four times a day (QID) | ORAL | Status: DC | PRN

## 2014-10-08 MED ORDER — PANTOPRAZOLE SODIUM 40 MG PO TBEC
80.0000 mg | DELAYED_RELEASE_TABLET | Freq: Every day | ORAL | Status: DC
  Administered 2014-10-08: 80 mg via ORAL
  Filled 2014-10-08 (×2): qty 2

## 2014-10-08 MED ORDER — PHENOL 1.4 % MT LIQD: 1.0000 | OROMUCOSAL | Status: DC | PRN

## 2014-10-08 MED ORDER — DIPHENHYDRAMINE HCL 12.5 MG/5ML PO ELIX: 12.5000 mg | ORAL_SOLUTION | ORAL | Status: DC | PRN

## 2014-10-08 MED ORDER — HYDROMORPHONE HCL 1 MG/ML IJ SOLN
INTRAMUSCULAR | Status: AC
  Filled 2014-10-08: qty 1

## 2014-10-08 MED ORDER — OXYBUTYNIN CHLORIDE 5 MG PO TABS
5.0000 mg | ORAL_TABLET | Freq: Every day | ORAL | Status: DC
  Administered 2014-10-08 – 2014-10-09 (×2): 5 mg via ORAL
  Filled 2014-10-08 (×2): qty 1

## 2014-10-08 MED ORDER — FENTANYL CITRATE (PF) 100 MCG/2ML IJ SOLN
INTRAMUSCULAR | Status: DC | PRN
  Administered 2014-10-08 (×2): 25 ug via INTRAVENOUS
  Administered 2014-10-08 (×2): 50 ug via INTRAVENOUS
  Administered 2014-10-08 (×2): 25 ug via INTRAVENOUS
  Administered 2014-10-08: 50 ug via INTRAVENOUS

## 2014-10-08 MED ORDER — TRANEXAMIC ACID 100 MG/ML IV SOLN
1000.0000 mg | INTRAVENOUS | Status: AC
  Administered 2014-10-08: 1000 mg via INTRAVENOUS
  Filled 2014-10-08: qty 10

## 2014-10-08 MED ORDER — SODIUM CHLORIDE 0.9 % IJ SOLN
INTRAMUSCULAR | Status: DC | PRN
  Administered 2014-10-08: 29 mL

## 2014-10-08 MED ORDER — MIDAZOLAM HCL 2 MG/2ML IJ SOLN
INTRAMUSCULAR | Status: AC
  Filled 2014-10-08: qty 2

## 2014-10-08 MED ORDER — SODIUM CHLORIDE 0.9 % IJ SOLN
INTRAMUSCULAR | Status: AC
  Filled 2014-10-08: qty 50

## 2014-10-08 MED ORDER — ISOPROPYL ALCOHOL 70 % SOLN
Status: AC
  Filled 2014-10-08: qty 480

## 2014-10-08 MED ORDER — METOCLOPRAMIDE HCL 5 MG/ML IJ SOLN
5.0000 mg | Freq: Three times a day (TID) | INTRAMUSCULAR | Status: DC | PRN
  Administered 2014-10-08: 10 mg via INTRAVENOUS

## 2014-10-08 MED ORDER — ISOPROPYL ALCOHOL 70 % SOLN
Status: DC | PRN
  Administered 2014-10-08: 1 via TOPICAL

## 2014-10-08 MED ORDER — CEFAZOLIN SODIUM-DEXTROSE 2-3 GM-% IV SOLR
2.0000 g | Freq: Four times a day (QID) | INTRAVENOUS | Status: AC
  Administered 2014-10-08 (×2): 2 g via INTRAVENOUS
  Filled 2014-10-08 (×2): qty 50

## 2014-10-08 MED ORDER — DEXTROSE 5 % IV SOLN
500.0000 mg | Freq: Four times a day (QID) | INTRAVENOUS | Status: DC | PRN
  Administered 2014-10-08 (×2): 500 mg via INTRAVENOUS
  Filled 2014-10-08 (×3): qty 5

## 2014-10-08 MED ORDER — AMLODIPINE BESYLATE 10 MG PO TABS
10.0000 mg | ORAL_TABLET | Freq: Every day | ORAL | Status: DC
  Administered 2014-10-09: 10 mg via ORAL
  Filled 2014-10-08: qty 1

## 2014-10-08 MED ORDER — BUPIVACAINE-EPINEPHRINE (PF) 0.25% -1:200000 IJ SOLN
INTRAMUSCULAR | Status: AC
  Filled 2014-10-08: qty 30

## 2014-10-08 MED ORDER — SODIUM CHLORIDE 0.9 % IR SOLN
Status: DC | PRN
  Administered 2014-10-08: 1000 mL
  Administered 2014-10-08: 3000 mL

## 2014-10-08 MED ORDER — ONDANSETRON HCL 4 MG/2ML IJ SOLN
INTRAMUSCULAR | Status: DC | PRN
  Administered 2014-10-08: 4 mg via INTRAVENOUS

## 2014-10-08 MED ORDER — MIDAZOLAM HCL 5 MG/5ML IJ SOLN
INTRAMUSCULAR | Status: DC | PRN
  Administered 2014-10-08: 2 mg via INTRAVENOUS

## 2014-10-08 MED ORDER — SENNA 8.6 MG PO TABS
2.0000 | ORAL_TABLET | Freq: Every day | ORAL | Status: DC
  Administered 2014-10-08: 17.2 mg via ORAL

## 2014-10-08 MED ORDER — LACTATED RINGERS IV SOLN: INTRAVENOUS | Status: DC

## 2014-10-08 MED ORDER — CEFAZOLIN SODIUM-DEXTROSE 2-3 GM-% IV SOLR
INTRAVENOUS | Status: AC
  Filled 2014-10-08: qty 50

## 2014-10-08 MED ORDER — ALUM & MAG HYDROXIDE-SIMETH 200-200-20 MG/5ML PO SUSP: 30.0000 mL | ORAL | Status: DC | PRN

## 2014-10-08 MED ORDER — PROMETHAZINE HCL 25 MG/ML IJ SOLN
12.5000 mg | Freq: Four times a day (QID) | INTRAMUSCULAR | Status: DC | PRN
Start: 2014-10-08 — End: 2014-10-09
  Administered 2014-10-08: 12.5 mg via INTRAVENOUS
  Filled 2014-10-08: qty 1

## 2014-10-08 MED ORDER — KETOROLAC TROMETHAMINE 15 MG/ML IJ SOLN
15.0000 mg | Freq: Four times a day (QID) | INTRAMUSCULAR | Status: AC
  Administered 2014-10-09 (×2): 15 mg via INTRAVENOUS
  Filled 2014-10-08 (×2): qty 1

## 2014-10-08 MED ORDER — ONDANSETRON HCL 4 MG/2ML IJ SOLN
4.0000 mg | Freq: Four times a day (QID) | INTRAMUSCULAR | Status: DC | PRN
  Administered 2014-10-08: 4 mg via INTRAVENOUS
  Filled 2014-10-08: qty 2

## 2014-10-08 MED ORDER — DOCUSATE SODIUM 100 MG PO CAPS
100.0000 mg | ORAL_CAPSULE | Freq: Two times a day (BID) | ORAL | Status: DC
  Administered 2014-10-08 – 2014-10-09 (×2): 100 mg via ORAL

## 2014-10-08 MED ORDER — PROMETHAZINE HCL 25 MG PO TABS: 25.0000 mg | ORAL_TABLET | Freq: Four times a day (QID) | ORAL | Status: DC | PRN

## 2014-10-08 MED ORDER — PROPOFOL INFUSION 10 MG/ML OPTIME
INTRAVENOUS | Status: DC | PRN
  Administered 2014-10-08: 75 ug/kg/min via INTRAVENOUS

## 2014-10-08 MED ORDER — HYDROMORPHONE HCL 1 MG/ML IJ SOLN
0.2500 mg | INTRAMUSCULAR | Status: DC | PRN
  Administered 2014-10-08 (×4): 0.5 mg via INTRAVENOUS

## 2014-10-08 MED ORDER — BUPIVACAINE-EPINEPHRINE (PF) 0.25% -1:200000 IJ SOLN
INTRAMUSCULAR | Status: DC | PRN
  Administered 2014-10-08: 30 mL

## 2014-10-08 MED ORDER — RIVAROXABAN 10 MG PO TABS
10.0000 mg | ORAL_TABLET | Freq: Every day | ORAL | Status: DC
  Administered 2014-10-09: 10 mg via ORAL
  Filled 2014-10-08 (×2): qty 1

## 2014-10-08 MED ORDER — ONDANSETRON HCL 4 MG PO TABS: 4.0000 mg | ORAL_TABLET | Freq: Four times a day (QID) | ORAL | Status: DC | PRN

## 2014-10-08 MED ORDER — METOCLOPRAMIDE HCL 10 MG PO TABS: 5.0000 mg | ORAL_TABLET | Freq: Three times a day (TID) | ORAL | Status: DC | PRN

## 2014-10-08 SURGICAL SUPPLY — 77 items
BAG ZIPLOCK 12X15 (MISCELLANEOUS) IMPLANT
BANDAGE ELASTIC 4 VELCRO ST LF (GAUZE/BANDAGES/DRESSINGS) ×3 IMPLANT
BANDAGE ELASTIC 6 VELCRO ST LF (GAUZE/BANDAGES/DRESSINGS) ×3 IMPLANT
BANDAGE ESMARK 6X9 LF (GAUZE/BANDAGES/DRESSINGS) ×1 IMPLANT
BLADE SAW RECIPROCATING 77.5 (BLADE) ×3 IMPLANT
BLADE SAW SGTL 81X20 HD (BLADE) ×6 IMPLANT
BNDG ESMARK 6X9 LF (GAUZE/BANDAGES/DRESSINGS) ×3
BONE CEMENT SIMPLEX TOBRAMYCIN (Cement) ×2 IMPLANT
BUR OVAL CARBIDE 4.0 (BURR) ×3 IMPLANT
CEMENT BONE 1-PACK (Cement) ×3 IMPLANT
CEMENT BONE SIMPLEX TOBRAMYCIN (Cement) ×1 IMPLANT
CHLORAPREP W/TINT 26ML (MISCELLANEOUS) ×3 IMPLANT
COUPLER LEGION OFFSET 6MM KNEE (Orthopedic Implant) ×6 IMPLANT
COVER SURGICAL LIGHT HANDLE (MISCELLANEOUS) ×3 IMPLANT
CUFF TOURN SGL QUICK 34 (TOURNIQUET CUFF) ×2
CUFF TRNQT CYL 34X4X40X1 (TOURNIQUET CUFF) ×1 IMPLANT
DECANTER SPIKE VIAL GLASS SM (MISCELLANEOUS) IMPLANT
DISTAL FEMORAL WEDGE SZ5 KNEE (Orthopedic Implant) ×6 IMPLANT
DRAPE EXTREMITY T 121X128X90 (DRAPE) ×3 IMPLANT
DRAPE LG THREE QUARTER DISP (DRAPES) ×3 IMPLANT
DRAPE POUCH INSTRU U-SHP 10X18 (DRAPES) ×3 IMPLANT
DRAPE U-SHAPE 47X51 STRL (DRAPES) ×3 IMPLANT
DRSG AQUACEL AG ADV 3.5X10 (GAUZE/BANDAGES/DRESSINGS) IMPLANT
DRSG AQUACEL AG ADV 3.5X14 (GAUZE/BANDAGES/DRESSINGS) ×3 IMPLANT
DRSG TEGADERM 4X4.75 (GAUZE/BANDAGES/DRESSINGS) ×3 IMPLANT
ELECT PENCIL ROCKER SW 15FT (MISCELLANEOUS) ×3 IMPLANT
ELECT REM PT RETURN 15FT ADLT (MISCELLANEOUS) ×3 IMPLANT
EVACUATOR 1/8 PVC DRAIN (DRAIN) ×3 IMPLANT
FACESHIELD WRAPAROUND (MASK) ×9 IMPLANT
FEMORAL ASSY SZ5 RT 62/70 KNEE (Orthopedic Implant) ×3 IMPLANT
GAUZE SPONGE 4X4 12PLY STRL (GAUZE/BANDAGES/DRESSINGS) ×3 IMPLANT
GLOVE BIO SURGEON STRL SZ8.5 (GLOVE) ×6 IMPLANT
GLOVE BIOGEL PI IND STRL 8.5 (GLOVE) ×1 IMPLANT
GLOVE BIOGEL PI INDICATOR 8.5 (GLOVE) ×2
GOWN SPEC L3 XXLG W/TWL (GOWN DISPOSABLE) ×3 IMPLANT
HANDPIECE INTERPULSE COAX TIP (DISPOSABLE) ×2
HINGE TIB BASEPLATE SZ3 KNEE (Orthopedic Implant) ×3 IMPLANT
HOOD PEEL AWAY FACE SHEILD DIS (HOOD) ×6 IMPLANT
INSERT ART SZ 2-3 13MM KNEE (Insert) ×3 IMPLANT
KIT BASIN OR (CUSTOM PROCEDURE TRAY) ×3 IMPLANT
KIT BOLT AND SLEEVE 13MM (Orthopedic Implant) ×3 IMPLANT
LEGION PRESSFIT STEM ×3 IMPLANT
LIQUID BAND (GAUZE/BANDAGES/DRESSINGS) ×3 IMPLANT
MANIFOLD NEPTUNE II (INSTRUMENTS) ×3 IMPLANT
NEEDLE SPNL 18GX3.5 QUINCKE PK (NEEDLE) ×3 IMPLANT
PACK TOTAL JOINT (CUSTOM PROCEDURE TRAY) ×3 IMPLANT
PADDING CAST COTTON 6X4 STRL (CAST SUPPLIES) ×3 IMPLANT
PATELLA RESURF GEN II 32MM (Orthopedic Implant) ×3 IMPLANT
PEN SKIN MARKING BROAD (MISCELLANEOUS) ×3 IMPLANT
PIN TROCAR 3 INCH (PIN) ×3 IMPLANT
POSITIONER SURGICAL ARM (MISCELLANEOUS) ×3 IMPLANT
SAW OSC TIP CART 19.5X105X1.3 (SAW) ×3 IMPLANT
SEALER BIPOLAR AQUA 6.0 (INSTRUMENTS) ×3 IMPLANT
SET HNDPC FAN SPRY TIP SCT (DISPOSABLE) ×1 IMPLANT
SET PAD KNEE POSITIONER (MISCELLANEOUS) ×3 IMPLANT
SOL PREP POV-IOD 4OZ 10% (MISCELLANEOUS) ×3 IMPLANT
SPONGE DRAIN TRACH 4X4 STRL 2S (GAUZE/BANDAGES/DRESSINGS) ×3 IMPLANT
STEM PRESSFIT STR 13X160 KNEE (Stem) ×3 IMPLANT
SUCTION FRAZIER 12FR DISP (SUCTIONS) ×3 IMPLANT
SUT MNCRL AB 3-0 PS2 18 (SUTURE) ×3 IMPLANT
SUT MON AB 2-0 CT1 36 (SUTURE) ×6 IMPLANT
SUT VIC AB 1 CT1 36 (SUTURE) ×3 IMPLANT
SUT VIC AB 2-0 CT1 27 (SUTURE) ×2
SUT VIC AB 2-0 CT1 TAPERPNT 27 (SUTURE) ×1 IMPLANT
SUT VLOC 180 0 24IN GS25 (SUTURE) ×3 IMPLANT
SWAB COLLECTION DEVICE MRSA (MISCELLANEOUS) ×3 IMPLANT
SYR 50ML LL SCALE MARK (SYRINGE) ×3 IMPLANT
TIBIAL WDG HEMI SZ3-4 LL/RM 5M (Orthopedic Implant) ×3 IMPLANT
TIBIAL WDG HEMI SZ3-4 LM/RL 5M (Orthopedic Implant) ×3 IMPLANT
TOWEL OR 17X26 10 PK STRL BLUE (TOWEL DISPOSABLE) ×6 IMPLANT
TOWEL OR NON WOVEN STRL DISP B (DISPOSABLE) ×3 IMPLANT
TOWER CARTRIDGE SMART MIX (DISPOSABLE) ×3 IMPLANT
TRAY FOLEY W/METER SILVER 14FR (SET/KITS/TRAYS/PACK) ×3 IMPLANT
TUBE ANAEROBIC SPECIMEN COL (MISCELLANEOUS) ×3 IMPLANT
WATER STERILE IRR 1500ML POUR (IV SOLUTION) ×6 IMPLANT
WRAP KNEE MAXI GEL POST OP (GAUZE/BANDAGES/DRESSINGS) ×3 IMPLANT
YANKAUER SUCT BULB TIP 10FT TU (MISCELLANEOUS) ×3 IMPLANT

## 2014-10-08 NOTE — Interval H&P Note (Signed)
History and Physical Interval Note:  10/08/2014 7:06 AM  Melissa Garrett  has presented today for surgery, with the diagnosis of FAILED RIGHT TOTAL KNEE ARTHROPLASTY  The various methods of treatment have been discussed with the patient and family. After consideration of risks, benefits and other options for treatment, the patient has consented to  Procedure(s): RIGHT TOTAL KNEE ARTHROPLASTY WITH REVISION  (Right) as a surgical intervention .  The patient's history has been reviewed, patient examined, no change in status, stable for surgery.  I have reviewed the patient's chart and labs.  Questions were answered to the patient's satisfaction.     Zekiah Coen, Horald Pollen

## 2014-10-08 NOTE — Brief Op Note (Signed)
10/08/2014  12:41 PM  PATIENT:  Melissa Garrett  74 y.o. female  PRE-OPERATIVE DIAGNOSIS:  FAILED RIGHT TOTAL KNEE ARTHROPLASTY  POST-OPERATIVE DIAGNOSIS:  FAILED RIGHT TOTAL KNEE ARTHROPLASTY  PROCEDURE:  Procedure(s): RIGHT TOTAL KNEE ARTHROPLASTY WITH REVISION  (Right)  SURGEON:  Surgeon(s) and Role:    * Rod Can, MD - Primary  PHYSICIAN ASSISTANT: none  ASSISTANTS: Roberto Scales, RNFA   ANESTHESIA:   spinal and general  EBL:  Total I/O In: 1000 [I.V.:1000] Out: 400 [Urine:250; Blood:150]  BLOOD ADMINISTERED:none  DRAINS: (1 medium) Hemovact drain(s) in the knee with  Suction Open   LOCAL MEDICATIONS USED:  MARCAINE     SPECIMEN:  Source of Specimen:  R knee joint fluid for culture  DISPOSITION OF SPECIMEN:  micro  COUNTS:  YES  TOURNIQUET:   Total Tourniquet Time Documented: Thigh (Right) - 119 minutes Thigh (Right) - 91 minutes Total: Thigh (Right) - 210 minutes   DICTATION: .Other Dictation: Dictation Number [id number]  PLAN OF CARE: Admit to inpatient   PATIENT DISPOSITION:  PACU - hemodynamically stable.   Delay start of Pharmacological VTE agent (>24hrs) due to surgical blood loss or risk of bleeding: not applicable

## 2014-10-08 NOTE — Anesthesia Preprocedure Evaluation (Signed)
Anesthesia Evaluation  Patient identified by MRN, date of birth, ID band Patient awake    Reviewed: Allergy & Precautions, H&P , NPO status , Patient's Chart, lab work & pertinent test results  Airway Mallampati: II  TM Distance: >3 FB Neck ROM: full    Dental no notable dental hx.    Pulmonary neg pulmonary ROS,  breath sounds clear to auscultation  Pulmonary exam normal       Cardiovascular hypertension, Pt. on medications + Peripheral Vascular Disease Rhythm:regular Rate:Normal  Right iliac artery stenosis   Neuro/Psych negative neurological ROS  negative psych ROS   GI/Hepatic negative GI ROS, Neg liver ROS, GERD-  Medicated and Controlled,Delayed gastric emptying   Endo/Other  negative endocrine ROS  Renal/GU Renal InsufficiencyRenal diseaseStage 3 chronic kidney disease  negative genitourinary   Musculoskeletal   Abdominal   Peds  Hematology negative hematology ROS (+)   Anesthesia Other Findings   Reproductive/Obstetrics negative OB ROS                             Anesthesia Physical Anesthesia Plan  ASA: III  Anesthesia Plan: Spinal   Post-op Pain Management:    Induction:   Airway Management Planned: Simple Face Mask  Additional Equipment:   Intra-op Plan:   Post-operative Plan:   Informed Consent: I have reviewed the patients History and Physical, chart, labs and discussed the procedure including the risks, benefits and alternatives for the proposed anesthesia with the patient or authorized representative who has indicated his/her understanding and acceptance.   Dental Advisory Given  Plan Discussed with: CRNA and Surgeon  Anesthesia Plan Comments:         Anesthesia Quick Evaluation

## 2014-10-08 NOTE — Anesthesia Postprocedure Evaluation (Signed)
  Anesthesia Post-op Note  Patient: Melissa Garrett  Procedure(s) Performed: Procedure(s) (LRB): RIGHT TOTAL KNEE ARTHROPLASTY WITH REVISION  (Right)  Patient Location: PACU  Anesthesia Type: spinal  Level of Consciousness: awake and alert   Airway and Oxygen Therapy: Patient Spontanous Breathing  Post-op Pain: mild  Post-op Assessment: Post-op Vital signs reviewed, Patient's Cardiovascular Status Stable, Respiratory Function Stable, Patent Airway and No signs of Nausea or vomiting  Last Vitals:  Filed Vitals:   10/08/14 1734  BP: 135/51  Pulse: 75  Temp: 36.3 C  Resp: 12    Post-op Vital Signs: stable   Complications: No apparent anesthesia complications

## 2014-10-08 NOTE — Anesthesia Procedure Notes (Signed)
Spinal Patient location during procedure: pre-op Start time: 10/08/2014 7:38 AM End time: 10/08/2014 7:47 AM Staffing Anesthesiologist: Alexis Frock Resident/CRNA: Lupita Raider F Performed by: resident/CRNA  Preanesthetic Checklist Completed: patient identified, site marked, surgical consent, pre-op evaluation, timeout performed, IV checked, risks and benefits discussed and monitors and equipment checked Spinal Block Patient position: sitting Prep: Betadine Patient monitoring: heart rate, cardiac monitor, continuous pulse ox and blood pressure Approach: midline Location: L3-4 Injection technique: single-shot Needle Needle type: Quincke  Needle gauge: 22 G Needle length: 5 cm Assessment Sensory level: T6 Additional Notes Spinocan Needle Tray Lot # [id number] Exp. 2017/10

## 2014-10-08 NOTE — Transfer of Care (Signed)
Immediate Anesthesia Transfer of Care Note  Patient: Melissa Garrett  Procedure(s) Performed: Procedure(s): RIGHT TOTAL KNEE ARTHROPLASTY WITH REVISION  (Right)  Patient Location: PACU  Anesthesia Type:Spinal  Level of Consciousness: awake, alert  and oriented  Airway & Oxygen Therapy: Patient Spontanous Breathing and Patient connected to face mask oxygen  Post-op Assessment: Report given to RN and Post -op Vital signs reviewed and stable  Post vital signs: Reviewed and stable  Last Vitals:  Filed Vitals:   10/08/14 0514  BP: 141/50  Pulse: 73  Temp: 36.4 C  Resp: 18    Complications: No apparent anesthesia complications
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
## 2014-10-09 LAB — BASIC METABOLIC PANEL
ANION GAP: 7 (ref 5–15)
BUN: 15 mg/dL (ref 6–23)
CALCIUM: 8.4 mg/dL (ref 8.4–10.5)
CO2: 23 mmol/L (ref 19–32)
Chloride: 110 mmol/L (ref 96–112)
Creatinine, Ser: 1.18 mg/dL — ABNORMAL HIGH (ref 0.50–1.10)
GFR calc Af Amer: 51 mL/min — ABNORMAL LOW (ref >90)
GFR, EST NON AFRICAN AMERICAN: 44 mL/min — AB (ref >90)
Glucose, Bld: 113 mg/dL — ABNORMAL HIGH (ref 70–99)
Potassium: 4.4 mmol/L (ref 3.5–5.1)
Sodium: 140 mmol/L (ref 135–145)

## 2014-10-09 LAB — CBC
HEMATOCRIT: 28.8 % — AB (ref 36.0–46.0)
HEMOGLOBIN: 9.4 g/dL — AB (ref 12.0–15.0)
MCH: 29.7 pg (ref 26.0–34.0)
MCHC: 32.6 g/dL (ref 30.0–36.0)
MCV: 91.1 fL (ref 78.0–100.0)
Platelets: 129 10*3/uL — ABNORMAL LOW (ref 150–400)
RBC: 3.16 MIL/uL — AB (ref 3.87–5.11)
RDW: 14 % (ref 11.5–15.5)
WBC: 10.3 10*3/uL (ref 4.0–10.5)

## 2014-10-09 MED ORDER — HYDROCODONE-ACETAMINOPHEN 5-325 MG PO TABS: 1.0000 | ORAL_TABLET | ORAL | Status: DC | PRN

## 2014-10-09 MED ORDER — ONDANSETRON HCL 4 MG PO TABS: 4.0000 mg | ORAL_TABLET | Freq: Four times a day (QID) | ORAL | Status: DC | PRN

## 2014-10-09 MED ORDER — RIVAROXABAN 10 MG PO TABS: 10.0000 mg | ORAL_TABLET | Freq: Every day | ORAL | Status: DC
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
## 2014-10-09 NOTE — Progress Notes (Signed)
Nutrition Brief Note  Patient identified on the Malnutrition Screening Tool (MST) Report  Wt Readings from Last 15 Encounters:  10/08/14 148 lb (67.132 kg)  10/06/14 148 lb (67.132 kg)  10/04/14 148 lb (67.132 kg)  09/30/14 148 lb (67.132 kg)  08/08/14 154 lb (69.854 kg)  07/19/14 150 lb (68.04 kg)  06/15/14 150 lb (68.04 kg)  06/12/14 150 lb (68.04 kg)  05/29/14 149 lb (67.586 kg)  04/25/14 149 lb 3.2 oz (67.677 kg)  04/12/14 149 lb 12.8 oz (67.949 kg)  03/28/14 150 lb 3.2 oz (68.13 kg)  03/22/14 151 lb (68.493 kg)    Body mass index is 25.39 kg/(m^2). Patient meets criteria for overweight based on current BMI.   Current diet order is regular, patient is consuming approximately 100% of meals at this time. Labs and medications reviewed.   No nutrition interventions warranted at this time. If nutrition issues arise, please consult RD.   Laurette Schimke [place], [place], [place]

## 2014-10-09 NOTE — Op Note (Signed)
Melissa Garrett, ACKERLEY NO.:  [id number]  MEDICAL RECORD NO.:  [id number]  LOCATION:  5409                         FACILITY:  [facility]  PHYSICIAN:  Rod Can, MD     DATE OF BIRTH:  07/31/1940  DATE OF PROCEDURE:  10/08/2014 DATE OF DISCHARGE:                              OPERATIVE REPORT   PREOPERATIVE DIAGNOSIS:  Failed right total knee arthroplasty.  POSTOPERATIVE DIAGNOSIS:  Failed right total knee arthroplasty.  PROCEDURE PERFORMED:  Revision right total knee arthroplasty of femoral, tibial, and patellar components.  EXPLANTS:  Smith and Nephew total knee, size 5 PS femur, size 3 tibial component, PS insert, 29 patellar button.  IMPLANTS: 1. Smith and Nephew hinged total knee replacement, size 5 femoral     component, with 15 x 120 mm Press-Fit stem with 6 mm offset and     10 mm distal augments x2. 2. Size 3 tibial component with 13 mm x 160 mm Press-Fit stem and 6     Mm stem offset. 3. A 13 mm polyethylene insert. 4. A 32 mm 3 peg round patella. 5. Simplex P antibiotic bone cement.  ANTIBIOTICS:  2 g of Ancef.  COMPLICATIONS:  None.  TUBES AND DRAINS:  Medium Hemovac x1.  SPECIMENS:  Right knee synovial fluid for culture.  ANESTHESIA:  Spinal plus general.  INDICATIONS:  The patient is a 74 year old female who underwent primary right total knee arthroplasty in 2005.  She had instability and then was revised by the original surgeon to a PS total knee in 2006.  The patient came to the office with inability to weight bear.  She was found to have a back knee gait and instability of the knee.  Risks, benefits, alternatives to revision to a hinged component were explained and she elected to proceed.  DESCRIPTION OF PROCEDURE IN DETAIL:  The patient was correctly identified in the preop holding area using 2 identifiers.  History and physical were reviewed and found to be appropriate.  Surgical site was marked by myself.  Patient was taken to  the operating room.  Spinal anesthesia was induced.  She was then laid supine on the operating table and a Foley catheter was inserted.  Nonsterile tourniquet was applied to the right groin and the right lower extremity was prepped and draped in normal sterile surgical fashion.  Time-out was called verifying site and site of surgery.  She did receive IV antibiotics within 60 minutes of beginning the procedure.  I began by using Esmarch exsanguination and I inflated the tourniquet to 300 mmHg.  I identified her previous anterior scar, I excised her scar sharply with a #10 blade.  Full-thickness skin flaps were created and dissection was carried to the extensor mechanism. I made a medial parapatellar arthrotomy.  Upon entering the knee, there was scant joint fluid.  There was no inflammation of the synovium, purulence or evidence of infection.  Her components were well fixed.  I brought the knee out into extension and I performed a medial release.  I then cleared the medial gutter, lateral gutter, suprapatellar pouch, and infrapatellar region of scar.  I took an ACL saw blade and  I went between the implant cement interface of the tibial tray and the femoral component.  I then used an osteotome to remove her poly liner.  I then used flexible osteotomes to further disrupt the implant cement interface.  I then used an impactor to remove her femur.  She did have some mild bone loss of the distal lateral femoral condyle.  I then removed her tibial tray.  Her bone was well preserved.  I then used an intramedullary drill to gain access to the canals of the femur and the tibia.  I used osteotome to remove all the cement in the proximal tibia. I then placed a 10 mm reamer in the tibia and I freshened the tibial cut.  I then sequentially reamed up to a 13 on the tibia and a 15 on the femur.  The femur was sized to a 5 and the tibia was sized to a 3.  I used a 6 mm offset adapter to get excellent  coverage on the tibia and the tibial surface was then prepared.  I assembled a trial tibia with a 6 mm offset and 160 mm stem and I also assembled a femoral trial with a 6 degree offset to posteriorize the femur and with a 120 mm stem, I put the trials in and she was noted to have a symmetric flexion gap. Whereby the previous femoral component was internally rotated, I added a posterolateral augment and this corrected her rotation.  I then put a trial in and in flexion, she was balanced very well with a 13 mm insert in extension, she still hyperextended.  I then assessed her patellar component, it was well fixed.  Her patella implant was tilted pretty significantly and there was uncovered bone laterally. The patella just did not track well, so I took an ACL blade and I went between the patella cement interface and I cut the pegs off.  I removed the pegs with a 4 mm high-speed bur. I re-sized the patella to a 32 and I then freehanded a cut taking care to leave a 12 mm remnant that was flush. I then drilled the holes for a 32 mm and upon trialing, there was great patellar tracking.  Due to hyperextension, the decision was therefore made  to proceed with a hinged replacement.  I then prepared the femur and tibia  to receive the hinge trial.  I built up the tibia with 5 mm augments medially and laterally and I assembled femoral trial.  I then put a reamer down the femur and I took the distal femoral cutting block and I balanced it with medial and lateral tension in flexion and I made a rectangular gap. This was pinned and then I cut the posterior condyle cuts.  This corrected her rotation very nicely.  I then put the trials in and the knee was balanced very nicely.  I trialed one time with 15 mm distal femoral augments and she was just too tight, especially in mid flexion. The real components were assembled on the back table.  The tourniquet was let down for 20 minutes and wound was irrigated.   I then put the tourniquet back up and then the bone was irrigated and dried. The bony surfaces were irrigated and dried.  The cement was mixed and then I cemented in first the tibia followed by the femur.  A trial spacer was placed.  The knee was brought into extension.  Excess cement was cleared after the cement had  fully hardened.  I then removed the trial implants.  I copiously irrigated the knee and then I placed the real 13 mm poly with the hinge assembly.  The hinged assembly was assembled per manufacture instructions and then the knee was tested for a final time.  She went 0-95 degrees with excellent stability.  The patella tracked very well.  The tourniquet was then let down.  Meticulous hemostasis was achieved.  Then, I irrigated with a dilute Betadine solution followed by bacitracin and followed by saline with pulse lavage.  I closed the arthrotomy over a medium Hemovac drain with #1 Vicryl and 0 V-Loc.  Deep dermal closure was performed with 2-0 interrupted Monocryl sutures in the subcuticular.  Tissues were closed with 3-0 running Monocryl stitch.  Dermabond was applied to the skin followed by an Aquacel dressing once the glue was fully hardened. Sterile dressing was then applied.  Sponge, needle, instrument counts were correct at the end of the case x2.  There were no known complications.  She was then awakened and sent to the PACU in stable condition.  I discussed operative events and findings with her family.  We will plan for Xarelto for DVT prophylaxis.  She can weight bear as tolerated with a walker.  We will give her pain medicine and routine IV antibiotics.  I will need to see her back in the office in 2 weeks.  All questions were solicited and answered to satisfaction.          ______________________________ Rod Can, MD     BS/MEDQ  D:  10/08/2014  T:  10/09/2014  Job:  [id number]

## 2014-10-09 NOTE — Plan of Care (Signed)
Problem: Consults Goal: Diagnosis- Total Joint Replacement Outcome: Completed/Met Date Met:  10/09/14 Revision Total Knee RIGHT

## 2014-10-09 NOTE — Discharge Instructions (Signed)
°Dr. Brian Swinteck °Total Joint Specialist °[facility] °[address] °[place], [place] [address] °[phone] ° °TOTAL KNEE REPLACEMENT POSTOPERATIVE DIRECTIONS ° ° ° °Knee Rehabilitation, Guidelines Following Surgery  °Results after knee surgery are often greatly improved when you follow the exercise, range of motion and muscle strengthening exercises prescribed by your doctor. Safety measures are also important to protect the knee from further injury. Any time any of these exercises cause you to have increased pain or swelling in your knee joint, decrease the amount until you are comfortable again and slowly increase them. If you have problems or questions, call your caregiver or physical therapist for advice.  ° °WEIGHT BEARING °Weight bearing as tolerated with assist device (walker, cane, etc) as directed, use it as long as suggested by your surgeon or therapist, typically at least 4-6 weeks. ° °HOME CARE INSTRUCTIONS  °Remove items at home which could result in a fall. This includes throw rugs or furniture in walking pathways.  °Continue medications as instructed at time of discharge. °You may have some home medications which will be placed on hold until you complete the course of blood thinner medication.  °You may start showering once you are discharged home but do not submerge the incision under water. Just pat the incision dry and apply a dry gauze dressing on daily. °Walk with walker as instructed.  °You may resume a sexual relationship in one month or when given the OK by your doctor.  °· Use walker as long as suggested by your caregivers. °· Avoid periods of inactivity such as sitting longer than an hour when not asleep. This helps prevent blood clots.  °You may put full weight on your legs and walk as much as is comfortable.  °You may return to work once you are cleared by your doctor.  °Do not drive a car for 6 weeks or until released by you surgeon.  °· Do not drive while  taking narcotics.  °Wear the elastic stockings for three weeks following surgery during the day but you may remove then at night. °Make sure you keep all of your appointments after your operation with all of your doctors and caregivers. You should call the office at the above phone number and make an appointment for approximately two weeks after the date of your surgery. °Do not remove your surgical dressing. The dressing is waterproof; you may take showers in 3 days, but do not take tub baths or submerge the dressing. °Please pick up a stool softener and laxative for home use as long as you are requiring pain medications. °· ICE to the affected knee every three hours for 30 minutes at a time and then as needed for pain and swelling.  Continue to use ice on the knee for pain and swelling from surgery. You may notice swelling that will progress down to the foot and ankle.  This is normal after surgery.  Elevate the leg when you are not up walking on it.   °It is important for you to complete the blood thinner medication as prescribed by your doctor. °· Continue to use the breathing machine which will help keep your temperature down.  It is common for your temperature to cycle up and down following surgery, especially at night when you are not up moving around and exerting yourself.  The breathing machine keeps your lungs expanded and your temperature down. ° °RANGE OF MOTION AND STRENGTHENING EXERCISES  °Rehabilitation of the knee is important following a   knee injury or an operation. After just a few days of immobilization, the muscles of the thigh which control the knee become weakened and shrink (atrophy). Knee exercises are designed to build up the tone and strength of the thigh muscles and to improve knee motion. Often times heat used for twenty to thirty minutes before working out will loosen up your tissues and help with improving the range of motion but do not use heat for the first two weeks following  surgery. These exercises can be done on a training (exercise) mat, on the floor, on a table or on a bed. Use what ever works the best and is most comfortable for you Knee exercises include:  Leg Lifts - While your knee is still immobilized in a splint or cast, you can do straight leg raises. Lift the leg to 60 degrees, hold for 3 sec, and slowly lower the leg. Repeat 10-20 times 2-3 times daily. Perform this exercise against resistance later as your knee gets better.  Quad and Hamstring Sets - Tighten up the muscle on the front of the thigh (Quad) and hold for 5-10 sec. Repeat this 10-20 times hourly. Hamstring sets are done by pushing the foot backward against an object and holding for 5-10 sec. Repeat as with quad sets.  A rehabilitation program following serious knee injuries can speed recovery and prevent re-injury in the future due to weakened muscles. Contact your doctor or a physical therapist for more information on knee rehabilitation.   SKILLED REHAB INSTRUCTIONS: If the patient is transferred to a skilled rehab facility following release from the hospital, a list of the current medications will be sent to the facility for the patient to continue.  When discharged from the skilled rehab facility, please have the facility set up the patient's [place] prior to being released. Also, the skilled facility will be responsible for providing the patient with their medications at time of release from the facility to include their pain medication, the muscle relaxants, and their blood thinner medication. If the patient is still at the rehab facility at time of the two week follow up appointment, the skilled rehab facility will also need to assist the patient in arranging follow up appointment in our office and any transportation needs.  MAKE SURE YOU:  Understand these instructions.  Will watch your condition.  Will get help right away if you are not doing well or get worse.     Pick up stool softner and laxative for home use following surgery while on pain medications. Do NOT remove your dressing. You may shower.  Do not take tub baths or submerge incision under water. May shower starting three days after surgery. Please use a clean towel to pat the incision dry following showers. Continue to use ice for pain and swelling after surgery. Do not use any lotions or creams on the incision until instructed by your surgeon. _____________________  Information on my medicine - XARELTO (Rivaroxaban)  This medication education was reviewed with me or my healthcare representative as part of my discharge preparation.   Why was Xarelto prescribed for you? Xarelto was prescribed for you to reduce the risk of blood clots forming after orthopedic surgery. The medical term for these abnormal blood clots is venous thromboembolism (VTE).  What do you need to know about xarelto ? Take your Xarelto ONCE DAILY at the same time every day. You may take it either with or without food.  If you have difficulty swallowing  the tablet whole, you may crush it and mix in applesauce just prior to taking your dose.  Take Xarelto exactly as prescribed by your doctor and DO NOT stop taking Xarelto without talking to the doctor who prescribed the medication.  Stopping without other VTE prevention medication to take the place of Xarelto may increase your risk of developing a clot.  After discharge, you should have regular check-up appointments with your healthcare provider that is prescribing your Xarelto.    What do you do if you miss a dose? If you miss a dose, take it as soon as you remember on the same day then continue your regularly scheduled once daily regimen the next day. Do not take two doses of Xarelto on the same day.   Important Safety Information A possible side effect of Xarelto is bleeding. You should call your healthcare provider right away if you experience any  of the following: ? Bleeding from an injury or your nose that does not stop. ? Unusual colored urine (red or dark brown) or unusual colored stools (red or black). ? Unusual bruising for unknown reasons. ? A serious fall or if you hit your head (even if there is no bleeding).  Some medicines may interact with Xarelto and might increase your risk of bleeding while on Xarelto. To help avoid this, consult your healthcare provider or pharmacist prior to using any new prescription or non-prescription medications, including herbals, vitamins, non-steroidal anti-inflammatory drugs (NSAIDs) and supplements.  This website has more information on Xarelto: [email or web address]

## 2014-10-09 NOTE — Evaluation (Signed)
Physical Therapy Evaluation Patient Details Name: Melissa Garrett MRN: [id number] DOB: 1941/06/10 Today's Date: 10/09/2014   History of Present Illness  Pt is s/p R knee revision.  Clinical Impression  Pt s/p R TKR revision presents with decreased R LE strength/ROM and post op pain limiting functional mobility.  Pt should progress well to dc home with assist of family and HHPT follow up.    Follow Up Recommendations Home health PT    Equipment Recommendations  3in1 (PT)    Recommendations for Other Services OT consult     Precautions / Restrictions Precautions Precautions: Fall Restrictions Weight Bearing Restrictions: No Other Position/Activity Restrictions: WBAT      Mobility  Bed Mobility Overal bed mobility: Needs Assistance Bed Mobility: Supine to Sit     Supine to sit: Min guard     General bed mobility comments: in chair   Transfers Overall transfer level: Needs assistance Equipment used: Rolling walker (2 wheeled) Transfers: Sit to/from Stand Sit to Stand: Min assist         General transfer comment: verbal cues for hand placement and LE management.  Ambulation/Gait Ambulation/Gait assistance: Min assist;Min guard Ambulation Distance (Feet): 120 Feet Assistive device: Rolling walker (2 wheeled) Gait Pattern/deviations: Step-to pattern;Step-through pattern;Decreased step length - right;Decreased step length - left;Shuffle;Trunk flexed     General Gait Details: cues for posture, position from RW and initial sequence  Stairs            Wheelchair Mobility    Modified Rankin (Stroke Patients Only)       Balance                                             Pertinent Vitals/Pain Pain Assessment: No/denies pain Pain Score: 3  Pain Location: R knee Pain Descriptors / Indicators: Aching;Sore Pain Intervention(s): Limited activity within patient's tolerance;Monitored during session;Premedicated before session;Ice applied     Home Living Family/patient expects to be discharged to:: Private residence Living Arrangements: Children Available Help at Discharge: Family Type of Home: House Home Access: Stairs to enter Entrance Stairs-Rails: None Entrance Stairs-Number of Steps: 2 Home Layout: One level;Able to live on main level with bedroom/bathroom Home Equipment: Gilford Rile - 2 wheels;Cane - single point;Wheelchair - manual      Prior Function Level of Independence: Independent with assistive device(s)         Comments: usually uses a cane inside of the home, a walker outside of the home     Hand Dominance   Dominant Hand: Right    Extremity/Trunk Assessment   Upper Extremity Assessment: Overall WFL for tasks assessed           Lower Extremity Assessment: RLE deficits/detail RLE Deficits / Details: 3/5 quads with AAROM at knee -5 - 40    Cervical / Trunk Assessment: Normal  Communication   Communication: No difficulties  Cognition Arousal/Alertness: Awake/alert Behavior During Therapy: WFL for tasks assessed/performed Overall Cognitive Status: Within Functional Limits for tasks assessed                      General Comments      Exercises Total Joint Exercises Ankle Circles/Pumps: AROM;Both;15 reps;Supine Quad Sets: AROM;Both;10 reps;Supine Heel Slides: AAROM;Right;15 reps;Supine Straight Leg Raises: AAROM;AROM;Right;15 reps;Supine      Assessment/Plan    PT Assessment Patient needs continued PT services  PT Diagnosis Difficulty  walking   PT Problem List Decreased strength;Decreased range of motion;Decreased activity tolerance;Decreased mobility;Decreased knowledge of use of DME;Pain  PT Treatment Interventions DME instruction;Gait training;Stair training;Functional mobility training;Therapeutic activities;Therapeutic exercise;Patient/family education   PT Goals (Current goals can be found in the Care Plan section) Acute Rehab PT Goals Patient Stated Goal: agreeable  to work with OT PT Goal Formulation: With patient Time For Goal Achievement: 10/16/14 Potential to Achieve Goals: Good    Frequency 7X/week   Barriers to discharge        Co-evaluation               End of Session Equipment Utilized During Treatment: Gait belt Activity Tolerance: Patient tolerated treatment well Patient left: in chair;with call bell/phone within reach;with family/visitor present Nurse Communication: Mobility status         Time: 8341-9622 PT Time Calculation (min) (ACUTE ONLY): 36 min   Charges:   PT Evaluation $Initial PT Evaluation Tier I: 1 Procedure PT Treatments $Therapeutic Exercise: 8-22 mins   PT G Codes:        Madline Oesterling Oct 11, 2014, 12:08 PM

## 2014-10-09 NOTE — Progress Notes (Signed)
   Subjective:  Patient reports pain as mild.  N/V yesterday evening - resolved. Denies CP/SOB.  Objective:   VITALS:   Filed Vitals:   10/08/14 1734 10/08/14 2225 10/09/14 0100 10/09/14 0617  BP: 135/51 136/49 136/45 144/81  Pulse: 75 73 66 63  Temp: 97.4 F (36.3 C) 97.8 F (36.6 C) 97.9 F (36.6 C) 97.7 F (36.5 C)  TempSrc: Oral Oral Oral Oral  Resp: 12 14 12 14   Height:      Weight:      SpO2: 100% 100% 100% 100%    ABD soft Sensation intact distally Intact pulses distally Dorsiflexion/Plantar flexion intact Incision: dressing C/D/I Compartment soft HV ss  Lab Results  Component Value Date   WBC 10.3 10/09/2014   HGB 9.4* 10/09/2014   HCT 28.8* 10/09/2014   MCV 91.1 10/09/2014   PLT 129* 10/09/2014   BMET    Component Value Date/Time   NA 140 10/09/2014 0520   K 4.4 10/09/2014 0520   CL 110 10/09/2014 0520   CO2 23 10/09/2014 0520   GLUCOSE 113* 10/09/2014 0520   BUN 15 10/09/2014 0520   CREATININE 1.18* 10/09/2014 0520   CALCIUM 8.4 10/09/2014 0520   GFRNONAA 44* 10/09/2014 0520   GFRAA 51* 10/09/2014 0520     Assessment/Plan: 1 Day Post-Op   Principal Problem:   Failed total right knee replacement Active Problems:   Failed total knee, right  WBAT with walker PO pain control DVT ppx: xarelto, foot pumps, TEDs PT/OT Advance diet Up with therapy D/C IV fluids Discharge home with home health today vs tomorrow    Melissa Garrett, Melissa Garrett 10/09/2014, 7:30 AM   Rod Can, MD Cell [phone]

## 2014-10-09 NOTE — Evaluation (Signed)
Occupational Therapy Evaluation Patient Details Name: Melissa Garrett MRN: [id number] DOB: June 20, 1941 Today's Date: 10/09/2014    History of Present Illness Pt is s/p R knee revision.   Clinical Impression   Pt practiced on and off comfort height commode and 3in1. Feel pt will be safer with 3in1 use. Daughter present and familiar with education from previous knee surgery. She is available to assist with ADL PRN. Will follow on acute if here after today but pt may d/c later today.    Follow Up Recommendations  No OT follow up;Supervision/Assistance - 24 hour    Equipment Recommendations  3 in 1 bedside comode    Recommendations for Other Services       Precautions / Restrictions Precautions Precautions: Fall Restrictions Weight Bearing Restrictions: No Other Position/Activity Restrictions: WBAT      Mobility Bed Mobility               General bed mobility comments: in chair   Transfers Overall transfer level: Needs assistance Equipment used: Rolling walker (2 wheeled) Transfers: Sit to/from Stand Sit to Stand: Min assist         General transfer comment: verbal cues for hand placement and LE management.    Balance                                            ADL Overall ADL's : Needs assistance/impaired Eating/Feeding: Independent;Sitting   Grooming: Wash/dry hands;Set up;Sitting   Upper Body Bathing: Set up;Sitting   Lower Body Bathing: Minimal assistance;Sit to/from stand   Upper Body Dressing : Set up;Sitting   Lower Body Dressing: Moderate assistance;Sit to/from stand   Toilet Transfer: Minimal assistance;Ambulation;BSC;RW;Comfort height toilet;Grab bars   Toileting- Clothing Manipulation and Hygiene: Minimal assistance;Sit to/from stand         General ADL Comments: Pt's daughter is present and will be able to assist at d/c. Pt not interested in AE other than a reacher and educated on where to obtain a Secondary school teacher. She states  daughter can assist with LB dressing also. Educated on how she can use reacher to don pants,, pick up items, etc. Pt has a tub and a tubseat so educated on how she can sit and "swivel" Les into tub wtih assist versus stepping into tub to sit on seat. Advised pt that if she has to step into tub that she may want  to wait a few days and sponge bathe to get stronger and be able to step into tub safer and let HH assess tub transfer. Pt verbalized understanidng. Pt needs frequent verbal cues for safety with walker including going slower with turns and making sure legs of walker dont get tangled with 3in1 legs. Pt stood from comfort height commode with bar with min assist and has a vanity and higher commode at home.  She did better with 3in1 to have bilateral armrests. She states the 3in1 may not fit over commode but she would like it as a BSC for night time. Explained if 3in1 would fit over commode, that would be better for safety to have bilateral UE support but if it wont, she can still use higher commode with vanity but educated daughter on how to safely assist pt. Educated on sequence for LB dressing and having walker in front of her when she stands to pull up clothing.      Vision  Perception     Praxis      Pertinent Vitals/Pain Pain Assessment: No/denies pain     Hand Dominance     Extremity/Trunk Assessment Upper Extremity Assessment Upper Extremity Assessment: Overall WFL for tasks assessed           Communication     Cognition Arousal/Alertness: Awake/alert Behavior During Therapy: WFL for tasks assessed/performed Overall Cognitive Status: Within Functional Limits for tasks assessed                     General Comments       Exercises       Shoulder Instructions      Home Living Family/patient expects to be discharged to:: Private residence Living Arrangements: Children Available Help at Discharge: Family Type of Home: House Home Access: Stairs to  enter Technical brewer of Steps: 2 Entrance Stairs-Rails: None Home Layout: One level;Able to live on main level with bedroom/bathroom     Bathroom Shower/Tub: Tub/shower unit;Curtain Shower/tub characteristics: Architectural technologist: Handicapped height Bathroom Accessibility: Yes   Home Equipment: Environmental consultant - 2 wheels;Cane - single point;Wheelchair - manual          Prior Functioning/Environment               OT Diagnosis: Generalized weakness   OT Problem List: Decreased strength;Decreased knowledge of use of DME or AE   OT Treatment/Interventions: Self-care/ADL training;Patient/family education;Therapeutic activities;DME and/or AE instruction    OT Goals(Current goals can be found in the care plan section) Acute Rehab OT Goals Patient Stated Goal: agreeable to work with OT OT Goal Formulation: With patient/family Time For Goal Achievement: 10/16/14 Potential to Achieve Goals: Good  OT Frequency: Min 2X/week   Barriers to D/C:            Co-evaluation              End of Session Equipment Utilized During Treatment: Rolling walker  Activity Tolerance: Patient tolerated treatment well Patient left: in chair;with call bell/phone within reach;with family/visitor present   Time: 9629-5284 OT Time Calculation (min): 19 min Charges:  OT General Charges $OT Visit: 1 Procedure OT Evaluation $Initial OT Evaluation Tier I: 1 Procedure G-Codes:    Jules Schick  [phone] 10/09/2014, 10:47 AM

## 2014-10-09 NOTE — Progress Notes (Signed)
Physical Therapy Treatment Patient Details Name: Melissa Garrett MRN: [id number] DOB: [birth year] Today's Date: 10/09/2014    History of Present Illness Pt is s/p R knee revision.    PT Comments    Pt progressing well and eager for dc home this date.  Follow Up Recommendations  Home health PT     Equipment Recommendations  3in1 (PT)    Recommendations for Other Services OT consult     Precautions / Restrictions Precautions Precautions: Fall Restrictions Weight Bearing Restrictions: No Other Position/Activity Restrictions: WBAT    Mobility  Bed Mobility Overal bed mobility: Modified Independent             General bed mobility comments: in chair   Transfers Overall transfer level: Needs assistance Equipment used: Rolling walker (2 wheeled) Transfers: Sit to/from Stand Sit to Stand: Supervision         General transfer comment: verbal cues for hand placement and LE management.  Ambulation/Gait Ambulation/Gait assistance: Min guard;Supervision Ambulation Distance (Feet): 40 Feet (and 20'; ltd by onset back pain) Assistive device: Rolling walker (2 wheeled) Gait Pattern/deviations: Step-to pattern;Decreased step length - right;Decreased step length - left;Shuffle;Trunk flexed     General Gait Details: cues for posture, position from RW and initial sequence   Stairs Stairs: Yes Stairs assistance: Min assist Stair Management: No rails;Step to pattern;Backwards;With walker Number of Stairs: 4 General stair comments: 2 stairs twice with dtr assisting on second attempt.  cues for sequence and foot/RW placement.    Wheelchair Mobility    Modified Rankin (Stroke Patients Only)       Balance                                    Cognition Arousal/Alertness: Awake/alert Behavior During Therapy: WFL for tasks assessed/performed Overall Cognitive Status: Within Functional Limits for tasks assessed                      Exercises Total  Joint Exercises Ankle Circles/Pumps: AROM;Both;15 reps;Supine Quad Sets: AROM;Both;10 reps;Supine Heel Slides: AAROM;Right;15 reps;Supine Straight Leg Raises: AAROM;AROM;Right;15 reps;Supine    General Comments        Pertinent Vitals/Pain Pain Assessment: 0-10 Pain Score: 3  Pain Location: R knee Pain Descriptors / Indicators: Aching;Sore Pain Intervention(s): Limited activity within patient's tolerance;Monitored during session;Premedicated before session;Ice applied    Home Living                      Prior Function            PT Goals (current goals can now be found in the care plan section) Acute Rehab PT Goals Patient Stated Goal: Home today PT Goal Formulation: With patient Time For Goal Achievement: 10/16/14 Potential to Achieve Goals: Good Progress towards PT goals: Progressing toward goals    Frequency  7X/week    PT Plan Current plan remains appropriate    Co-evaluation             End of Session Equipment Utilized During Treatment: Gait belt Activity Tolerance: Patient tolerated treatment well Patient left: in chair;with call bell/phone within reach;with family/visitor present     Time: 1340-1406 PT Time Calculation (min) (ACUTE ONLY): 26 min  Charges:  $Gait Training: 8-22 mins $Therapeutic Exercise: 8-22 mins                    G Codes:  Aki Abalos 10/09/2014, 2:38 PM

## 2014-10-10 DIAGNOSIS — M109 Gout, unspecified: Secondary | ICD-10-CM | POA: Diagnosis not present

## 2014-10-10 DIAGNOSIS — N183 Chronic kidney disease, stage 3 (moderate): Secondary | ICD-10-CM | POA: Diagnosis not present

## 2014-10-10 DIAGNOSIS — I129 Hypertensive chronic kidney disease with stage 1 through stage 4 chronic kidney disease, or unspecified chronic kidney disease: Secondary | ICD-10-CM | POA: Diagnosis not present

## 2014-10-10 DIAGNOSIS — K219 Gastro-esophageal reflux disease without esophagitis: Secondary | ICD-10-CM | POA: Diagnosis not present

## 2014-10-10 DIAGNOSIS — Z96652 Presence of left artificial knee joint: Secondary | ICD-10-CM | POA: Diagnosis not present

## 2014-10-10 DIAGNOSIS — T84022D Instability of internal right knee prosthesis, subsequent encounter: Secondary | ICD-10-CM | POA: Diagnosis not present

## 2014-10-11 DIAGNOSIS — M109 Gout, unspecified: Secondary | ICD-10-CM | POA: Diagnosis not present

## 2014-10-11 DIAGNOSIS — I129 Hypertensive chronic kidney disease with stage 1 through stage 4 chronic kidney disease, or unspecified chronic kidney disease: Secondary | ICD-10-CM | POA: Diagnosis not present

## 2014-10-11 DIAGNOSIS — K219 Gastro-esophageal reflux disease without esophagitis: Secondary | ICD-10-CM | POA: Diagnosis not present

## 2014-10-11 DIAGNOSIS — N183 Chronic kidney disease, stage 3 (moderate): Secondary | ICD-10-CM | POA: Diagnosis not present

## 2014-10-11 DIAGNOSIS — T84022D Instability of internal right knee prosthesis, subsequent encounter: Secondary | ICD-10-CM | POA: Diagnosis not present

## 2014-10-11 LAB — BODY FLUID CULTURE
Culture: NO GROWTH
Gram Stain: NONE SEEN

## 2014-10-13 LAB — ANAEROBIC CULTURE

## 2014-10-14 DIAGNOSIS — K219 Gastro-esophageal reflux disease without esophagitis: Secondary | ICD-10-CM | POA: Diagnosis not present

## 2014-10-14 DIAGNOSIS — I129 Hypertensive chronic kidney disease with stage 1 through stage 4 chronic kidney disease, or unspecified chronic kidney disease: Secondary | ICD-10-CM | POA: Diagnosis not present

## 2014-10-14 DIAGNOSIS — N183 Chronic kidney disease, stage 3 (moderate): Secondary | ICD-10-CM | POA: Diagnosis not present

## 2014-10-14 DIAGNOSIS — M109 Gout, unspecified: Secondary | ICD-10-CM | POA: Diagnosis not present

## 2014-10-14 DIAGNOSIS — T84022D Instability of internal right knee prosthesis, subsequent encounter: Secondary | ICD-10-CM | POA: Diagnosis not present

## 2014-10-16 DIAGNOSIS — T84022D Instability of internal right knee prosthesis, subsequent encounter: Secondary | ICD-10-CM | POA: Diagnosis not present

## 2014-10-16 DIAGNOSIS — M109 Gout, unspecified: Secondary | ICD-10-CM | POA: Diagnosis not present

## 2014-10-16 DIAGNOSIS — K219 Gastro-esophageal reflux disease without esophagitis: Secondary | ICD-10-CM | POA: Diagnosis not present

## 2014-10-16 DIAGNOSIS — I129 Hypertensive chronic kidney disease with stage 1 through stage 4 chronic kidney disease, or unspecified chronic kidney disease: Secondary | ICD-10-CM | POA: Diagnosis not present

## 2014-10-16 DIAGNOSIS — N183 Chronic kidney disease, stage 3 (moderate): Secondary | ICD-10-CM | POA: Diagnosis not present

## 2014-10-21 ENCOUNTER — Encounter (HOSPITAL_COMMUNITY): Payer: Self-pay | Admitting: Orthopedic Surgery

## 2014-10-22 ENCOUNTER — Encounter (HOSPITAL_COMMUNITY): Payer: Self-pay | Admitting: Orthopedic Surgery

## 2014-10-22 ENCOUNTER — Telehealth: Payer: Self-pay | Admitting: Family Medicine

## 2014-10-22 DIAGNOSIS — I129 Hypertensive chronic kidney disease with stage 1 through stage 4 chronic kidney disease, or unspecified chronic kidney disease: Secondary | ICD-10-CM | POA: Diagnosis not present

## 2014-10-22 DIAGNOSIS — T84022D Instability of internal right knee prosthesis, subsequent encounter: Secondary | ICD-10-CM | POA: Diagnosis not present

## 2014-10-22 DIAGNOSIS — M109 Gout, unspecified: Secondary | ICD-10-CM | POA: Diagnosis not present

## 2014-10-22 DIAGNOSIS — N183 Chronic kidney disease, stage 3 (moderate): Secondary | ICD-10-CM | POA: Diagnosis not present

## 2014-10-22 DIAGNOSIS — K219 Gastro-esophageal reflux disease without esophagitis: Secondary | ICD-10-CM | POA: Diagnosis not present

## 2014-10-22 NOTE — Telephone Encounter (Signed)
Appointment given for tomorrow with Sabra Heck @11 :56

## 2014-10-23 ENCOUNTER — Other Ambulatory Visit: Payer: Self-pay | Admitting: Family Medicine

## 2014-10-23 ENCOUNTER — Ambulatory Visit: Payer: Commercial Managed Care - HMO | Admitting: Family Medicine

## 2014-10-23 DIAGNOSIS — M109 Gout, unspecified: Secondary | ICD-10-CM | POA: Diagnosis not present

## 2014-10-23 DIAGNOSIS — T84022D Instability of internal right knee prosthesis, subsequent encounter: Secondary | ICD-10-CM | POA: Diagnosis not present

## 2014-10-23 DIAGNOSIS — N183 Chronic kidney disease, stage 3 (moderate): Secondary | ICD-10-CM | POA: Diagnosis not present

## 2014-10-23 DIAGNOSIS — K219 Gastro-esophageal reflux disease without esophagitis: Secondary | ICD-10-CM | POA: Diagnosis not present

## 2014-10-23 DIAGNOSIS — I129 Hypertensive chronic kidney disease with stage 1 through stage 4 chronic kidney disease, or unspecified chronic kidney disease: Secondary | ICD-10-CM | POA: Diagnosis not present

## 2014-10-24 NOTE — Telephone Encounter (Signed)
Last seen 08/08/14 Dr Sabra Heck  On 10/09/14 Dr Delfino Lovett filled for #[phone]

## 2014-10-25 DIAGNOSIS — N183 Chronic kidney disease, stage 3 (moderate): Secondary | ICD-10-CM | POA: Diagnosis not present

## 2014-10-25 DIAGNOSIS — M109 Gout, unspecified: Secondary | ICD-10-CM | POA: Diagnosis not present

## 2014-10-25 DIAGNOSIS — I129 Hypertensive chronic kidney disease with stage 1 through stage 4 chronic kidney disease, or unspecified chronic kidney disease: Secondary | ICD-10-CM | POA: Diagnosis not present

## 2014-10-25 DIAGNOSIS — T84022D Instability of internal right knee prosthesis, subsequent encounter: Secondary | ICD-10-CM | POA: Diagnosis not present

## 2014-10-25 DIAGNOSIS — K219 Gastro-esophageal reflux disease without esophagitis: Secondary | ICD-10-CM | POA: Diagnosis not present

## 2014-10-28 ENCOUNTER — Ambulatory Visit (HOSPITAL_COMMUNITY): Payer: Commercial Managed Care - HMO | Attending: Orthopedic Surgery | Admitting: Physical Therapy

## 2014-10-28 DIAGNOSIS — R29898 Other symptoms and signs involving the musculoskeletal system: Secondary | ICD-10-CM

## 2014-10-28 DIAGNOSIS — R2689 Other abnormalities of gait and mobility: Secondary | ICD-10-CM

## 2014-10-28 DIAGNOSIS — Z471 Aftercare following joint replacement surgery: Secondary | ICD-10-CM | POA: Diagnosis not present

## 2014-10-28 DIAGNOSIS — M25661 Stiffness of right knee, not elsewhere classified: Secondary | ICD-10-CM | POA: Diagnosis not present

## 2014-10-28 DIAGNOSIS — Z96651 Presence of right artificial knee joint: Secondary | ICD-10-CM | POA: Insufficient documentation

## 2014-10-28 DIAGNOSIS — M25669 Stiffness of unspecified knee, not elsewhere classified: Secondary | ICD-10-CM

## 2014-10-28 DIAGNOSIS — R262 Difficulty in walking, not elsewhere classified: Secondary | ICD-10-CM | POA: Diagnosis not present

## 2014-10-28 DIAGNOSIS — T8489XA Other specified complication of internal orthopedic prosthetic devices, implants and grafts, initial encounter: Secondary | ICD-10-CM

## 2014-10-28 DIAGNOSIS — Z96659 Presence of unspecified artificial knee joint: Secondary | ICD-10-CM
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
## 2014-10-28 NOTE — Patient Instructions (Signed)
Knee Extension (Sitting)   Place _0__ pound weight on left ankle and straighten knee fully, lower slowly. Repeat __15__ times per set. Do ___1_ sets per session. Do _3___ sessions per day.  http://orth.exer.us/732   Copyright  VHI. All rights reserved.  Strengthening: Quadriceps Set   Tighten muscles on top of thighs by pushing knees down into surface. Hold __3-5__ seconds. Repeat _15_ times per set. Do __1__ sets per session. Do ___3_ sessions per day.  http://orth.exer.us/602   Copyright  VHI. All rights reserved.  Hip Abduction / Adduction: with Extended Knee (Supine)   Bring left leg out to side and return. Keep knee straight. Repeat __10__ times per set. Do __1__ sets per session. Do _3___ sessions per day.  http://orth.exer.us/680   Copyright  VHI. All rights reserved.  Bridging   Slowly raise buttocks from floor, keeping stomach tight. Repeat _10_ times per set. Do __1__ sets per session. Do _3___ sessions per day.  http://orth.exer.us/1096   Copyright  VHI. All rights reserved.  Self-Mobilization: Heel Slide (Supine)   Slide left heel toward buttocks until a gentle stretch is felt. Hold ___5_ seconds. Relax. Repeat __10__ times per set. Do __1__ sets per session. Do __3__ sessions per day.  http://orth.exer.us/710   Copyright  VHI. All rights reserved.  Strengthening: Terminal Knee Extension (Supine)   With right knee over bolster, straighten knee by tightening muscles on top of thigh. Keep bottom of knee on bolster. Repeat _15___ times per set. Do _1___ sets per session. Do ___3_ sessions per day.  http://orth.exer.us/626   Copyright  VHI. All rights reserved.  Strengthening: Straight Leg Raise (Phase 1)   Tighten muscles on front of right thigh, then lift leg __18__ inches from surface, keeping knee locked.  Repeat ___10_ times per set. Do ____ sets per session. Do ___3_ sessions per day.  http://orth.exer.us/614   Copyright  VHI. All  rights reserved.  Strengthening: Hip Abduction (Side-Lying)   Tighten muscles on front of left thigh, then lift leg _10___ inches from surface, keeping knee locked.  Repeat __10__ times per set. Do __1__ sets per session. Do __2__ sessions per day.  http://orth.exer.us/622   Copyright  VHI. All rights reserved.  Self-Mobilization: Knee Flexion (Prone)  Lie on your side and bend your knee  Bring left heel toward buttocks as close as possible. Hold __3__ seconds. Relax. Repeat __15__ times per set. Do ___1_ sets per session. Do ____ sessions per day. 3 http://orth.exer.us/596   Copyright  VHI. All rights reserved.  Scapular Retraction (Standing)   With arms at sides, pinch shoulder blades together. Repeat __10__ times per set. Do __1__ sets per session. Do __2__ sessions per day.  http://orth.exer.us/944   Copyright  VHI. All rights reserved.

## 2014-11-06 ENCOUNTER — Ambulatory Visit (HOSPITAL_COMMUNITY): Payer: Commercial Managed Care - HMO

## 2014-11-06 ENCOUNTER — Telehealth (HOSPITAL_COMMUNITY): Payer: Self-pay

## 2014-11-08 ENCOUNTER — Ambulatory Visit (HOSPITAL_COMMUNITY): Payer: Commercial Managed Care - HMO | Admitting: Physical Therapy

## 2014-11-08 DIAGNOSIS — R2689 Other abnormalities of gait and mobility: Secondary | ICD-10-CM

## 2014-11-08 DIAGNOSIS — Z96659 Presence of unspecified artificial knee joint: Principal | ICD-10-CM

## 2014-11-08 DIAGNOSIS — T8489XA Other specified complication of internal orthopedic prosthetic devices, implants and grafts, initial encounter: Principal | ICD-10-CM

## 2014-11-08 DIAGNOSIS — R29898 Other symptoms and signs involving the musculoskeletal system: Secondary | ICD-10-CM | POA: Diagnosis not present

## 2014-11-08 DIAGNOSIS — R262 Difficulty in walking, not elsewhere classified: Secondary | ICD-10-CM

## 2014-11-08 DIAGNOSIS — M25661 Stiffness of right knee, not elsewhere classified: Secondary | ICD-10-CM | POA: Diagnosis not present

## 2014-11-08 DIAGNOSIS — Z471 Aftercare following joint replacement surgery: Secondary | ICD-10-CM | POA: Diagnosis not present

## 2014-11-08 DIAGNOSIS — M25669 Stiffness of unspecified knee, not elsewhere classified: Secondary | ICD-10-CM

## 2014-11-08 DIAGNOSIS — Z96651 Presence of right artificial knee joint: Secondary | ICD-10-CM | POA: Diagnosis not present
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
## 2014-11-12 ENCOUNTER — Ambulatory Visit (HOSPITAL_COMMUNITY): Payer: Commercial Managed Care - HMO | Admitting: Physical Therapy

## 2014-11-12 DIAGNOSIS — M25661 Stiffness of right knee, not elsewhere classified: Secondary | ICD-10-CM | POA: Diagnosis not present

## 2014-11-12 DIAGNOSIS — T8489XA Other specified complication of internal orthopedic prosthetic devices, implants and grafts, initial encounter: Principal | ICD-10-CM

## 2014-11-12 DIAGNOSIS — R2689 Other abnormalities of gait and mobility: Secondary | ICD-10-CM

## 2014-11-12 DIAGNOSIS — R29898 Other symptoms and signs involving the musculoskeletal system: Secondary | ICD-10-CM

## 2014-11-12 DIAGNOSIS — R262 Difficulty in walking, not elsewhere classified: Secondary | ICD-10-CM

## 2014-11-12 DIAGNOSIS — Z96659 Presence of unspecified artificial knee joint: Principal | ICD-10-CM

## 2014-11-12 DIAGNOSIS — M25669 Stiffness of unspecified knee, not elsewhere classified: Secondary | ICD-10-CM

## 2014-11-12 DIAGNOSIS — Z471 Aftercare following joint replacement surgery: Secondary | ICD-10-CM | POA: Diagnosis not present

## 2014-11-12 DIAGNOSIS — Z96651 Presence of right artificial knee joint: Secondary | ICD-10-CM | POA: Diagnosis not present
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
## 2014-11-14 ENCOUNTER — Ambulatory Visit (HOSPITAL_COMMUNITY): Payer: Commercial Managed Care - HMO | Admitting: Physical Therapy

## 2014-11-14 NOTE — Telephone Encounter (Signed)
Patient canceled her appt but did'nt want to give a reason.

## 2014-11-15 ENCOUNTER — Ambulatory Visit (HOSPITAL_COMMUNITY): Payer: Commercial Managed Care - HMO | Admitting: Physical Therapy

## 2014-11-19 ENCOUNTER — Ambulatory Visit (HOSPITAL_COMMUNITY): Payer: Commercial Managed Care - HMO | Admitting: Physical Therapy

## 2014-11-19 DIAGNOSIS — R29898 Other symptoms and signs involving the musculoskeletal system: Secondary | ICD-10-CM | POA: Diagnosis not present

## 2014-11-19 DIAGNOSIS — R2689 Other abnormalities of gait and mobility: Secondary | ICD-10-CM

## 2014-11-19 DIAGNOSIS — R262 Difficulty in walking, not elsewhere classified: Secondary | ICD-10-CM

## 2014-11-19 DIAGNOSIS — M25669 Stiffness of unspecified knee, not elsewhere classified: Secondary | ICD-10-CM

## 2014-11-19 DIAGNOSIS — Z96659 Presence of unspecified artificial knee joint: Principal | ICD-10-CM

## 2014-11-19 DIAGNOSIS — M25661 Stiffness of right knee, not elsewhere classified: Secondary | ICD-10-CM | POA: Diagnosis not present

## 2014-11-19 DIAGNOSIS — Z471 Aftercare following joint replacement surgery: Secondary | ICD-10-CM | POA: Diagnosis not present

## 2014-11-19 DIAGNOSIS — Z96651 Presence of right artificial knee joint: Secondary | ICD-10-CM | POA: Diagnosis not present

## 2014-11-19 DIAGNOSIS — T8489XA Other specified complication of internal orthopedic prosthetic devices, implants and grafts, initial encounter: Principal | ICD-10-CM
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
## 2014-11-20 ENCOUNTER — Encounter (HOSPITAL_COMMUNITY): Payer: Commercial Managed Care - HMO | Admitting: Physical Therapy

## 2014-11-22 ENCOUNTER — Ambulatory Visit (HOSPITAL_COMMUNITY): Payer: Commercial Managed Care - HMO | Attending: Orthopedic Surgery

## 2014-11-22 DIAGNOSIS — Z471 Aftercare following joint replacement surgery: Secondary | ICD-10-CM | POA: Insufficient documentation

## 2014-11-22 DIAGNOSIS — R262 Difficulty in walking, not elsewhere classified: Secondary | ICD-10-CM | POA: Diagnosis not present

## 2014-11-22 DIAGNOSIS — R29898 Other symptoms and signs involving the musculoskeletal system: Secondary | ICD-10-CM | POA: Diagnosis not present

## 2014-11-22 DIAGNOSIS — R2689 Other abnormalities of gait and mobility: Secondary | ICD-10-CM

## 2014-11-22 DIAGNOSIS — Z96659 Presence of unspecified artificial knee joint: Secondary | ICD-10-CM

## 2014-11-22 DIAGNOSIS — Z96651 Presence of right artificial knee joint: Secondary | ICD-10-CM | POA: Diagnosis not present

## 2014-11-22 DIAGNOSIS — M25669 Stiffness of unspecified knee, not elsewhere classified: Secondary | ICD-10-CM

## 2014-11-22 DIAGNOSIS — M25661 Stiffness of right knee, not elsewhere classified: Secondary | ICD-10-CM | POA: Diagnosis not present

## 2014-11-22 DIAGNOSIS — T8489XA Other specified complication of internal orthopedic prosthetic devices, implants and grafts, initial encounter: Secondary | ICD-10-CM
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
## 2014-11-25 ENCOUNTER — Ambulatory Visit (HOSPITAL_COMMUNITY): Payer: Commercial Managed Care - HMO | Admitting: Physical Therapy

## 2014-11-25 DIAGNOSIS — Z96651 Presence of right artificial knee joint: Secondary | ICD-10-CM | POA: Diagnosis not present

## 2014-11-25 DIAGNOSIS — Z471 Aftercare following joint replacement surgery: Secondary | ICD-10-CM | POA: Diagnosis not present

## 2014-11-26 ENCOUNTER — Telehealth: Payer: Self-pay | Admitting: Family Medicine

## 2014-11-27 ENCOUNTER — Ambulatory Visit (HOSPITAL_COMMUNITY): Payer: Commercial Managed Care - HMO

## 2014-11-27 NOTE — Telephone Encounter (Signed)
No show, called and pt stated she went to MD yesterday and he told her 90 degree flexion was about all she would be able to complete.  Referral made for back pain.  Pt wishes to discharge therapy.  Aldona Lento, PTA

## 2014-11-29 ENCOUNTER — Encounter (HOSPITAL_COMMUNITY): Payer: Commercial Managed Care - HMO

## 2014-12-02 ENCOUNTER — Encounter (HOSPITAL_COMMUNITY): Payer: Commercial Managed Care - HMO | Admitting: Physical Therapy

## 2014-12-04 ENCOUNTER — Encounter (HOSPITAL_COMMUNITY): Payer: Commercial Managed Care - HMO | Admitting: Physical Therapy

## 2014-12-06 ENCOUNTER — Encounter (HOSPITAL_COMMUNITY): Payer: Commercial Managed Care - HMO | Admitting: Physical Therapy

## 2014-12-09 ENCOUNTER — Ambulatory Visit (INDEPENDENT_AMBULATORY_CARE_PROVIDER_SITE_OTHER): Payer: Commercial Managed Care - HMO | Admitting: Family Medicine

## 2014-12-09 ENCOUNTER — Encounter: Payer: Self-pay | Admitting: Family Medicine

## 2014-12-09 ENCOUNTER — Encounter (INDEPENDENT_AMBULATORY_CARE_PROVIDER_SITE_OTHER): Payer: Self-pay

## 2014-12-09 VITALS — BP 169/63 | HR 60 | Temp 97.3°F | Ht 64.0 in | Wt 142.0 lb

## 2014-12-09 DIAGNOSIS — M545 Low back pain: Secondary | ICD-10-CM

## 2014-12-09 DIAGNOSIS — N183 Chronic kidney disease, stage 3 unspecified: Secondary | ICD-10-CM

## 2014-12-09 DIAGNOSIS — I10 Essential (primary) hypertension: Secondary | ICD-10-CM | POA: Diagnosis not present

## 2014-12-09 NOTE — Addendum Note (Signed)
Addended by: Earlene Plater on: 12/09/2014 11:23 AM   Modules accepted: Miquel Dunn

## 2014-12-09 NOTE — Progress Notes (Signed)
Subjective:    Patient ID: Melissa Garrett, female    DOB: 16-Oct-1940, 74 y.o.   MRN: [id number]  HPI Pt here for follow up and management of chronic medical problems which includes hypertension and CKD. She is taking medications regularly.  She is status post right total knee replacement, the third. She has been in rehabilitation but now back pain is limiting her ability to rehabilitate her knee. She does have a follow-up appointment with another orthopedist regarding her back pain.  Her appetite has returned but she is still 6 pounds under what she was back in April. She did not take her blood pressure meds this morning and pressure is consequently elevated. It was however normal at her last visit here.      Patient Active Problem List   Diagnosis Date Noted  . Failed total right knee replacement 10/08/2014  . Failed total knee, right 10/08/2014  . CKD (chronic kidney disease) stage 3, GFR 30-59 ml/min 03/27/2014  . Acute on chronic renal failure 03/27/2014  . Nausea and vomiting 03/27/2014  . Acute diarrhea 03/27/2014  . Right iliac artery stenosis 03/27/2014  . Nausea & vomiting 03/27/2014  . Essential hypertension 03/22/2014  . Loss of weight 03/22/2014   Outpatient Encounter Prescriptions as of 12/09/2014  Medication Sig  . allopurinol (ZYLOPRIM) 100 MG tablet Take 1 tablet (100 mg total) by mouth daily.  Marland Kitchen amLODipine (NORVASC) 10 MG tablet Take 1 tablet (10 mg total) by mouth daily.  . Capsicum, Cayenne, (CAYENNE PO) Take 1 capsule by mouth daily.  Marland Kitchen docusate sodium (COLACE) 100 MG capsule Take 100 mg by mouth daily.   Marland Kitchen esomeprazole (NEXIUM) 40 MG capsule Take 1 capsule (40 mg total) by mouth at bedtime.  . lactobacillus acidophilus (BACID) TABS tablet Take 1 tablet by mouth daily. PROBIOTIC  . Lecithin 1200 MG CAPS Take 1,200 mg by mouth daily.   . methocarbamol (ROBAXIN) 500 MG tablet Take 1 tablet (500 mg total) by mouth 2 (two) times daily as needed for muscle spasms.  .  Multiple Vitamin (MULTIVITAMIN WITH MINERALS) TABS tablet Take 1 tablet by mouth daily.  . ondansetron (ZOFRAN) 4 MG tablet TAKE 1 TABLET EVERY 6 HOURS AS NEEDED FOR NAUSEA  OR VOMITING  . oxybutynin (DITROPAN) 5 MG tablet Take 1 tablet by mouth daily.   . [DISCONTINUED] HYDROcodone-acetaminophen (NORCO/VICODIN) 5-325 MG per tablet Take 1-2 tablets by mouth every 4 (four) hours as needed (breakthrough pain).  . [DISCONTINUED] rivaroxaban (XARELTO) 10 MG TABS tablet Take 1 tablet (10 mg total) by mouth daily with breakfast.   Facility-Administered Encounter Medications as of 12/09/2014  Medication  . ondansetron (ZOFRAN) 4 mg in sodium chloride 0.9 % 50 mL IVPB      Review of Systems  Constitutional: Negative.   HENT: Negative.   Eyes: Negative.   Respiratory: Negative.   Cardiovascular: Negative.   Gastrointestinal: Negative.   Endocrine: Negative.   Genitourinary: Negative.   Musculoskeletal: Negative.   Skin: Negative.   Allergic/Immunologic: Negative.   Neurological: Negative.   Hematological: Negative.   Psychiatric/Behavioral: Negative.        Objective:   Physical Exam  Constitutional: She is oriented to person, place, and time. She appears well-developed and well-nourished.  Cardiovascular: Normal rate, regular rhythm and normal heart sounds.   Pulmonary/Chest: Effort normal and breath sounds normal.  Neurological: She is alert and oriented to person, place, and time.  Psychiatric: She has a normal mood and affect.   BP 169/63 mmHg  Pulse 60  Temp(Src) 97.3 F (36.3 C) (Oral)  Ht 5' 4"  (1.626 m)  Wt 142 lb (64.411 kg)  BMI 24.36 kg/m2        Assessment & Plan:  1. Essential hypertension Blood pressure elevated slightly. This may be because she has not taken her medicine today. Asked to follow pressures at home - BMP8+EGFR - For home use only DME high strength lightweight manual wheelchair with seat cushion  2. CKD (chronic kidney disease) stage 3, GFR  30-59 ml/min Not sure about this diagnosis. Her renal function in April showed normal BUN/creatinine and creatinine - BMP8+EGFR - For home use only DME high strength lightweight manual wheelchair with seat cushion  3. Low back pain, unspecified back pain laterality, with sciatica presence unspecified This is her main problem today she will be seeing orthopedists later this month - For home use only DME high strength lightweight manual wheelchair with seat cushion - Ambulatory referral to Orthopedic Surgery  Wardell Honour MD

## 2014-12-09 NOTE — Patient Instructions (Signed)
Medicare Annual Wellness Visit  [place] and the medical providers at [facility] strive to bring you the best medical care.  In doing so we not only want to address your current medical conditions and concerns but also to detect new conditions early and prevent illness, disease and health-related problems.    Medicare offers a yearly Wellness Visit which allows our clinical staff to assess your need for preventative services including immunizations, lifestyle education, counseling to decrease risk of preventable diseases and screening for fall risk and other medical concerns.    This visit is provided free of charge (no copay) for all Medicare recipients. The clinical pharmacists at [facility] have begun to conduct these Wellness Visits which will also include a thorough review of all your medications.    As you primary medical provider recommend that you make an appointment for your Annual Wellness Visit if you have not done so already this year.  You may set up this appointment before you leave today or you may call back ([phone]) and schedule an appointment.  Please make sure when you call that you mention that you are scheduling your Annual Wellness Visit with the clinical pharmacist so that the appointment may be made for the proper length of time.     Continue current medications. Continue good therapeutic lifestyle changes which include good diet and exercise. Fall precautions discussed with patient. If an FOBT was given today- please return it to our front desk. If you are over 50 years old - you may need Prevnar 13 or the adult Pneumonia vaccine.  Flu Shots are still available at our office. If you still haven't had one please call to set up a nurse visit to get one.   After your visit with us today you will receive a survey in the mail or online from [employer] regarding your care with us. Please take a moment to  fill this out. Your feedback is very important to us as you can help us better understand your patient needs as well as improve your experience and satisfaction. WE CARE ABOUT YOU!!!   

## 2014-12-10 LAB — BMP8+EGFR
BUN/Creatinine Ratio: 13 (ref 11–26)
BUN: 13 mg/dL (ref 8–27)
CHLORIDE: 104 mmol/L (ref 97–108)
CO2: 23 mmol/L (ref 18–29)
Calcium: 9.3 mg/dL (ref 8.7–10.3)
Creatinine, Ser: 0.98 mg/dL (ref 0.57–1.00)
GFR calc Af Amer: 66 mL/min/{1.73_m2} (ref >59)
GFR calc non Af Amer: 57 mL/min/{1.73_m2} — ABNORMAL LOW (ref >59)
Glucose: 85 mg/dL (ref 65–99)
Potassium: 4.1 mmol/L (ref 3.5–5.2)
SODIUM: 142 mmol/L (ref 134–144)

## 2014-12-11 NOTE — Progress Notes (Addendum)
Patient has mobility limitations that impair her ability to do mobility related activities of daily living in the home. The patient cannot use a cane or crutches to resolve the issue completely. She can safely propel the wheelchair in the home or has a caregiver that can provide assistance. Melissa Garrett

## 2014-12-13 ENCOUNTER — Telehealth: Payer: Self-pay | Admitting: Family Medicine

## 2014-12-17 DIAGNOSIS — M5136 Other intervertebral disc degeneration, lumbar region: Secondary | ICD-10-CM | POA: Diagnosis not present

## 2015-01-02 ENCOUNTER — Other Ambulatory Visit: Payer: Self-pay | Admitting: Nurse Practitioner

## 2015-01-02 ENCOUNTER — Other Ambulatory Visit: Payer: Self-pay | Admitting: Family Medicine

## 2015-01-06 ENCOUNTER — Telehealth: Payer: Self-pay | Admitting: Family Medicine

## 2015-01-06 NOTE — Telephone Encounter (Signed)
Stp's daughter who states her BP is now 163/71, pt has had 4 episodes of vomiting today so she hasn't been able to keep her BP meds down. Pt just took her zofran about 1.5 hours ago. No c/o headaches, visual disturbances or dizziness. Advised to hydrate and to monitor, if symptoms persist or get worse to CB. Pt's daughter voiced understanding.

## 2015-01-07 ENCOUNTER — Other Ambulatory Visit: Payer: Self-pay | Admitting: Family Medicine

## 2015-03-19 ENCOUNTER — Ambulatory Visit: Payer: Commercial Managed Care - HMO | Admitting: Family Medicine

## 2015-04-09 ENCOUNTER — Encounter: Payer: Self-pay | Admitting: Family Medicine

## 2015-04-22 ENCOUNTER — Ambulatory Visit: Payer: Commercial Managed Care - HMO | Admitting: Family Medicine

## 2015-04-24 ENCOUNTER — Ambulatory Visit: Payer: Commercial Managed Care - HMO | Admitting: Family Medicine

## 2015-04-29 ENCOUNTER — Ambulatory Visit: Payer: Commercial Managed Care - HMO | Admitting: Family Medicine

## 2015-06-09 ENCOUNTER — Ambulatory Visit: Payer: Commercial Managed Care - HMO | Admitting: Family Medicine

## 2015-06-27 DIAGNOSIS — K30 Functional dyspepsia: Secondary | ICD-10-CM | POA: Diagnosis not present

## 2015-06-27 DIAGNOSIS — Z888 Allergy status to other drugs, medicaments and biological substances status: Secondary | ICD-10-CM | POA: Diagnosis not present

## 2015-06-27 DIAGNOSIS — R0789 Other chest pain: Secondary | ICD-10-CM | POA: Diagnosis not present

## 2015-06-27 DIAGNOSIS — I10 Essential (primary) hypertension: Secondary | ICD-10-CM | POA: Diagnosis not present

## 2015-06-27 DIAGNOSIS — M109 Gout, unspecified: Secondary | ICD-10-CM | POA: Diagnosis not present

## 2015-06-27 DIAGNOSIS — E7800 Pure hypercholesterolemia, unspecified: Secondary | ICD-10-CM | POA: Diagnosis not present

## 2015-06-27 DIAGNOSIS — K219 Gastro-esophageal reflux disease without esophagitis: Secondary | ICD-10-CM | POA: Diagnosis not present

## 2015-07-01 ENCOUNTER — Ambulatory Visit: Payer: Commercial Managed Care - HMO | Admitting: Family Medicine

## 2015-07-07 ENCOUNTER — Other Ambulatory Visit: Payer: Self-pay | Admitting: Family Medicine

## 2015-07-08 NOTE — Telephone Encounter (Signed)
Please make an appointment for this patient for follow-up with Dr. Sabra Heck and okay prescription for 1 month

## 2015-07-08 NOTE — Telephone Encounter (Signed)
Last seen 12/09/14  Dr Sabra Heck  Requesting 90 day supply

## 2015-07-21 ENCOUNTER — Other Ambulatory Visit: Payer: Self-pay | Admitting: Family Medicine

## 2015-07-22 NOTE — Telephone Encounter (Signed)
Last seen 12/09/14  Dr Sabra Heck  Requesting 90 day supply

## 2015-07-25 ENCOUNTER — Encounter: Payer: Self-pay | Admitting: Family Medicine

## 2015-07-25 ENCOUNTER — Ambulatory Visit (INDEPENDENT_AMBULATORY_CARE_PROVIDER_SITE_OTHER): Payer: Commercial Managed Care - HMO | Admitting: Family Medicine

## 2015-07-25 VITALS — BP 150/62 | HR 71 | Temp 96.6°F | Ht 64.0 in | Wt 137.0 lb

## 2015-07-25 DIAGNOSIS — Z23 Encounter for immunization: Secondary | ICD-10-CM | POA: Diagnosis not present

## 2015-07-25 DIAGNOSIS — I10 Essential (primary) hypertension: Secondary | ICD-10-CM | POA: Diagnosis not present

## 2015-07-25 MED ORDER — MIRTAZAPINE 7.5 MG PO TABS: 7.5000 mg | ORAL_TABLET | Freq: Every day | ORAL | Status: DC

## 2015-07-25 MED ORDER — AMLODIPINE BESYLATE 10 MG PO TABS: 10.0000 mg | ORAL_TABLET | Freq: Every day | ORAL | Status: DC

## 2015-07-25 MED ORDER — BACLOFEN 10 MG PO TABS: 10.0000 mg | ORAL_TABLET | Freq: Three times a day (TID) | ORAL | Status: DC | PRN

## 2015-07-25 NOTE — Progress Notes (Signed)
   Subjective:    Patient ID: Melissa Garrett, female    DOB: 1940-09-08, 75 y.o.   MRN: [id number]  HPI 75 year old female here to follow-up hypertension. Blood pressure has been doing better. Medicines include amlodipine. She has dependent edema occasionally probably not related to the amlodipine. Blood pressure today is 150/62 compared to 169/63 at last visit 7 months ago. Her biggest complaint is her mobility areas she has had 3 knee replacements and uses a walker most of the time I walked with her in the hall and she does well with a walker but and does not try to walk without it typically. She also has some chronic kidney disease stage III with a GFR 57. She is worried about weight loss. We discussed this and will try a medicine to increase her appetite.  Patient Active Problem List   Diagnosis Date Noted  . Failed total right knee replacement ([place]) 10/08/2014  . Failed total knee, right ([place]) 10/08/2014  . CKD (chronic kidney disease) stage 3, GFR 30-59 ml/min 03/27/2014  . Acute on chronic renal failure ([place]) 03/27/2014  . Nausea and vomiting 03/27/2014  . Acute diarrhea 03/27/2014  . Right iliac artery stenosis ([place]) 03/27/2014  . Nausea & vomiting 03/27/2014  . Essential hypertension 03/22/2014  . Loss of weight 03/22/2014   Outpatient Encounter Prescriptions as of 07/25/2015  Medication Sig  . allopurinol (ZYLOPRIM) 100 MG tablet TAKE 1 TABLET (100 MG TOTAL) DAILY.  Marland Kitchen amLODipine (NORVASC) 10 MG tablet Take 1 tablet (10 mg total) by mouth daily.  . Capsicum, Cayenne, (CAYENNE PO) Take 1 capsule by mouth daily.  Marland Kitchen docusate sodium (COLACE) 100 MG capsule Take 100 mg by mouth daily.   Marland Kitchen esomeprazole (NEXIUM) 40 MG capsule TAKE 1 CAPSULE (40 MG TOTAL) AT BEDTIME.  Marland Kitchen lactobacillus acidophilus (BACID) TABS tablet Take 1 tablet by mouth daily. PROBIOTIC  . Lecithin 1200 MG CAPS Take 1,200 mg by mouth daily.   . Multiple Vitamin (MULTIVITAMIN WITH MINERALS) TABS tablet Take 1 tablet by mouth  daily.  . ondansetron (ZOFRAN) 4 MG tablet TAKE 1 TABLET EVERY 6 HOURS AS NEEDED FOR NAUSEA OR VOMITING  . oxybutynin (DITROPAN) 5 MG tablet Take 1 tablet by mouth daily.   . [DISCONTINUED] methocarbamol (ROBAXIN) 500 MG tablet Take 1 tablet (500 mg total) by mouth 2 (two) times daily as needed for muscle spasms.   Facility-Administered Encounter Medications as of 07/25/2015  Medication  . ondansetron (ZOFRAN) 4 mg in sodium chloride 0.9 % 50 mL IVPB      Review of Systems  Constitutional: Positive for unexpected weight change.  HENT: Negative.   Respiratory: Negative.   Musculoskeletal: Positive for back pain.  Neurological: Negative.   Psychiatric/Behavioral: Negative.        Objective:   Physical Exam  Constitutional: She is oriented to person, place, and time. She appears well-developed and well-nourished.  HENT:  Head: Normocephalic.  Cardiovascular: Normal rate, regular rhythm and normal heart sounds.   Pulmonary/Chest: Effort normal and breath sounds normal.  Neurological: She is alert and oriented to person, place, and time.  Psychiatric: She has a normal mood and affect. Her behavior is normal.          Assessment & Plan:  1. Essential hypertension Into new with amlodipine 10 mg. Will check CMP to reassess renal function as well as lipid panel. - CMP14+EGFR - Lipid panel  Wardell Honour MD

## 2015-07-26 LAB — CMP14+EGFR
A/G RATIO: 2 (ref 1.1–2.5)
ALBUMIN: 4 g/dL (ref 3.5–4.8)
ALT: 10 IU/L (ref 0–32)
AST: 18 IU/L (ref 0–40)
Alkaline Phosphatase: 53 IU/L (ref 39–117)
BILIRUBIN TOTAL: 0.4 mg/dL (ref 0.0–1.2)
BUN / CREAT RATIO: 10 — AB (ref 11–26)
BUN: 8 mg/dL (ref 8–27)
CHLORIDE: 103 mmol/L (ref 96–106)
CO2: 23 mmol/L (ref 18–29)
CREATININE: 0.81 mg/dL (ref 0.57–1.00)
Calcium: 9.5 mg/dL (ref 8.7–10.3)
GFR calc Af Amer: 82 mL/min/{1.73_m2} (ref >59)
GFR calc non Af Amer: 71 mL/min/{1.73_m2} (ref >59)
GLOBULIN, TOTAL: 2 g/dL (ref 1.5–4.5)
Glucose: 76 mg/dL (ref 65–99)
POTASSIUM: 3.9 mmol/L (ref 3.5–5.2)
Sodium: 142 mmol/L (ref 134–144)
Total Protein: 6 g/dL (ref 6.0–8.5)

## 2015-07-26 LAB — LIPID PANEL
CHOLESTEROL TOTAL: 243 mg/dL — AB (ref 100–199)
Chol/HDL Ratio: 4.6 ratio units — ABNORMAL HIGH (ref 0.0–4.4)
HDL: 53 mg/dL (ref >39)
LDL CALC: 169 mg/dL — AB (ref 0–99)
TRIGLYCERIDES: 104 mg/dL (ref 0–149)
VLDL Cholesterol Cal: 21 mg/dL (ref 5–40)

## 2015-08-16 DIAGNOSIS — I10 Essential (primary) hypertension: Secondary | ICD-10-CM | POA: Diagnosis not present

## 2015-08-16 DIAGNOSIS — M109 Gout, unspecified: Secondary | ICD-10-CM | POA: Diagnosis not present

## 2015-08-16 DIAGNOSIS — M47812 Spondylosis without myelopathy or radiculopathy, cervical region: Secondary | ICD-10-CM | POA: Diagnosis not present

## 2015-08-16 DIAGNOSIS — X58XXXA Exposure to other specified factors, initial encounter: Secondary | ICD-10-CM | POA: Diagnosis not present

## 2015-08-16 DIAGNOSIS — Z79899 Other long term (current) drug therapy: Secondary | ICD-10-CM | POA: Diagnosis not present

## 2015-08-16 DIAGNOSIS — M542 Cervicalgia: Secondary | ICD-10-CM | POA: Diagnosis not present

## 2015-08-16 DIAGNOSIS — K219 Gastro-esophageal reflux disease without esophagitis: Secondary | ICD-10-CM | POA: Diagnosis not present

## 2015-08-16 DIAGNOSIS — S161XXA Strain of muscle, fascia and tendon at neck level, initial encounter: Secondary | ICD-10-CM | POA: Diagnosis not present

## 2015-09-19 ENCOUNTER — Telehealth: Payer: Self-pay | Admitting: Family Medicine

## 2015-09-19 ENCOUNTER — Other Ambulatory Visit: Payer: Self-pay | Admitting: Family Medicine

## 2015-09-22 MED ORDER — ESOMEPRAZOLE MAGNESIUM 40 MG PO CPDR: DELAYED_RELEASE_CAPSULE | ORAL | Status: DC

## 2015-09-22 MED ORDER — AMLODIPINE BESYLATE 10 MG PO TABS: 10.0000 mg | ORAL_TABLET | Freq: Every day | ORAL | Status: DC

## 2015-09-22 NOTE — Telephone Encounter (Signed)
done

## 2015-10-14 ENCOUNTER — Telehealth: Payer: Self-pay | Admitting: Family Medicine

## 2015-10-14 MED ORDER — PENICILLIN V POTASSIUM 500 MG PO TABS: 500.0000 mg | ORAL_TABLET | Freq: Four times a day (QID) | ORAL | Status: DC

## 2015-10-14 NOTE — Telephone Encounter (Signed)
This is a Dr.Miller pt. She has an abscess tooth and doesn't have any money to go to the dentist. Wants to know if we can send an antibiotic in?

## 2015-10-14 NOTE — Telephone Encounter (Signed)
Pen-Vee K 500 mg #40 one 4 times a day for infection until completed

## 2015-10-14 NOTE — Telephone Encounter (Signed)
Pt aware.

## 2015-11-14 ENCOUNTER — Telehealth: Payer: Self-pay | Admitting: Family Medicine

## 2015-11-14 MED ORDER — PENICILLIN V POTASSIUM 500 MG PO TABS: 500.0000 mg | ORAL_TABLET | Freq: Four times a day (QID) | ORAL | Status: DC

## 2015-11-14 NOTE — Telephone Encounter (Signed)
Patient called complaining of toothache and no money for dentist.: Penicillin (allergic to Cipro) 500 mg 4 times a day 1 week

## 2015-11-15 DIAGNOSIS — I10 Essential (primary) hypertension: Secondary | ICD-10-CM | POA: Diagnosis not present

## 2015-11-15 DIAGNOSIS — R200 Anesthesia of skin: Secondary | ICD-10-CM | POA: Diagnosis not present

## 2015-11-15 DIAGNOSIS — M5412 Radiculopathy, cervical region: Secondary | ICD-10-CM | POA: Diagnosis not present

## 2015-11-15 DIAGNOSIS — K219 Gastro-esophageal reflux disease without esophagitis: Secondary | ICD-10-CM | POA: Diagnosis not present

## 2015-11-15 DIAGNOSIS — Z79899 Other long term (current) drug therapy: Secondary | ICD-10-CM | POA: Diagnosis not present

## 2015-11-20 ENCOUNTER — Other Ambulatory Visit: Payer: Self-pay | Admitting: Family Medicine

## 2015-11-20 NOTE — Telephone Encounter (Signed)
Not on cholesterol med, pt aware.

## 2015-12-01 ENCOUNTER — Other Ambulatory Visit: Payer: Self-pay | Admitting: Family Medicine

## 2015-12-24 ENCOUNTER — Other Ambulatory Visit: Payer: Self-pay | Admitting: Family Medicine

## 2015-12-29 ENCOUNTER — Other Ambulatory Visit: Payer: Self-pay | Admitting: Family Medicine

## 2015-12-30 MED ORDER — BACLOFEN 10 MG PO TABS: 10.0000 mg | ORAL_TABLET | Freq: Three times a day (TID) | ORAL | Status: DC | PRN

## 2015-12-30 NOTE — Telephone Encounter (Signed)
Done by Verbal from Dr. Sabra Heck at 12:10 PM

## 2016-01-16 ENCOUNTER — Other Ambulatory Visit: Payer: Self-pay

## 2016-01-16 MED ORDER — ONDANSETRON HCL 4 MG PO TABS: ORAL_TABLET | ORAL | 0 refills | Status: DC

## 2016-01-16 MED ORDER — BACLOFEN 10 MG PO TABS: 10.0000 mg | ORAL_TABLET | Freq: Three times a day (TID) | ORAL | 0 refills | Status: DC | PRN

## 2016-01-16 MED ORDER — AMLODIPINE BESYLATE 10 MG PO TABS: 10.0000 mg | ORAL_TABLET | Freq: Every day | ORAL | 0 refills | Status: DC

## 2016-01-22 ENCOUNTER — Other Ambulatory Visit: Payer: Self-pay | Admitting: *Deleted

## 2016-01-22 MED ORDER — ONDANSETRON HCL 4 MG PO TABS: ORAL_TABLET | ORAL | 0 refills | Status: DC

## 2016-01-22 MED ORDER — AMLODIPINE BESYLATE 10 MG PO TABS: 10.0000 mg | ORAL_TABLET | Freq: Every day | ORAL | 1 refills | Status: DC

## 2016-01-22 MED ORDER — BACLOFEN 10 MG PO TABS: 10.0000 mg | ORAL_TABLET | Freq: Three times a day (TID) | ORAL | 0 refills | Status: DC | PRN

## 2016-01-23 ENCOUNTER — Ambulatory Visit (INDEPENDENT_AMBULATORY_CARE_PROVIDER_SITE_OTHER): Payer: Commercial Managed Care - HMO | Admitting: Family Medicine

## 2016-01-23 ENCOUNTER — Encounter: Payer: Self-pay | Admitting: Family Medicine

## 2016-01-23 VITALS — BP 152/60 | HR 68 | Temp 97.4°F | Ht 64.0 in | Wt 148.0 lb

## 2016-01-23 DIAGNOSIS — I10 Essential (primary) hypertension: Secondary | ICD-10-CM | POA: Diagnosis not present

## 2016-01-23 DIAGNOSIS — T84092D Other mechanical complication of internal right knee prosthesis, subsequent encounter: Secondary | ICD-10-CM | POA: Diagnosis not present

## 2016-01-23 DIAGNOSIS — M545 Low back pain: Secondary | ICD-10-CM | POA: Diagnosis not present

## 2016-01-23 DIAGNOSIS — N183 Chronic kidney disease, stage 3 unspecified: Secondary | ICD-10-CM

## 2016-01-23 DIAGNOSIS — T84012D Broken internal right knee prosthesis, subsequent encounter: Secondary | ICD-10-CM

## 2016-01-23 LAB — CMP14+EGFR
A/G RATIO: 1.7 (ref 1.2–2.2)
ALK PHOS: 59 IU/L (ref 39–117)
ALT: 11 IU/L (ref 0–32)
AST: 22 IU/L (ref 0–40)
Albumin: 3.9 g/dL (ref 3.5–4.8)
BUN/Creatinine Ratio: 9 — ABNORMAL LOW (ref 12–28)
BUN: 8 mg/dL (ref 8–27)
Bilirubin Total: 0.3 mg/dL (ref 0.0–1.2)
CO2: 24 mmol/L (ref 18–29)
Calcium: 9.5 mg/dL (ref 8.7–10.3)
Chloride: 103 mmol/L (ref 96–106)
Creatinine, Ser: 0.93 mg/dL (ref 0.57–1.00)
GFR calc Af Amer: 70 mL/min/{1.73_m2} (ref >59)
GFR calc non Af Amer: 60 mL/min/{1.73_m2} (ref >59)
GLOBULIN, TOTAL: 2.3 g/dL (ref 1.5–4.5)
Glucose: 84 mg/dL (ref 65–99)
POTASSIUM: 4 mmol/L (ref 3.5–5.2)
SODIUM: 142 mmol/L (ref 134–144)
Total Protein: 6.2 g/dL (ref 6.0–8.5)

## 2016-01-23 LAB — LIPID PANEL
CHOL/HDL RATIO: 5.2 ratio — AB (ref 0.0–4.4)
CHOLESTEROL TOTAL: 225 mg/dL — AB (ref 100–199)
HDL: 43 mg/dL (ref >39)
LDL Calculated: 147 mg/dL — ABNORMAL HIGH (ref 0–99)
TRIGLYCERIDES: 177 mg/dL — AB (ref 0–149)
VLDL Cholesterol Cal: 35 mg/dL (ref 5–40)

## 2016-01-23 MED ORDER — AMOXICILLIN-POT CLAVULANATE 875-125 MG PO TABS: 1.0000 | ORAL_TABLET | Freq: Two times a day (BID) | ORAL | 0 refills | Status: DC

## 2016-01-23 MED ORDER — BACLOFEN 10 MG PO TABS: 10.0000 mg | ORAL_TABLET | Freq: Three times a day (TID) | ORAL | 0 refills | Status: DC | PRN

## 2016-01-23 NOTE — Progress Notes (Signed)
Subjective:    Patient ID: Melissa Garrett, female    DOB: 1940-07-14, 75 y.o.   MRN: [id number]  HPI Pt here for follow up and management of chronic medical problems which includes hyperlipidemia. She is taking medications regularly. Still having problems with tooth pain right first molar lower. Has appointment Monday with [facility] to see about having her tooth pulled. Otherwise no particular concerns or complaints. Needs refill on baclofen for pain around knee     Patient Active Problem List   Diagnosis Date Noted  . 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 Failed total knee, right ([place]) 10/08/2014  . CKD (chronic kidney disease) stage 3, GFR 30-59 ml/min 03/27/2014  . Acute on chronic renal failure ([place]) 03/27/2014  . Nausea and vomiting 03/27/2014  . Acute diarrhea 03/27/2014  . 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 Nausea & vomiting 03/27/2014  . Essential hypertension 03/22/2014  . Loss of weight 03/22/2014   Outpatient Encounter Prescriptions as of 01/23/2016  Medication Sig  . allopurinol (ZYLOPRIM) 100 MG tablet TAKE 1 TABLET (100 MG TOTAL) DAILY.  Marland Kitchen amLODipine (NORVASC) 10 MG tablet Take 1 tablet (10 mg total) by mouth daily.  . baclofen (LIORESAL) 10 MG tablet Take 1 tablet (10 mg total) by mouth 3 (three) times daily as needed for muscle spasms.  . Capsicum, Cayenne, (CAYENNE PO) Take 1 capsule by mouth daily.  Marland Kitchen docusate sodium (COLACE) 100 MG capsule Take 100 mg by mouth daily.   Marland Kitchen esomeprazole (NEXIUM) 40 MG capsule TAKE 1 CAPSULE (40 MG TOTAL) AT BEDTIME.  Marland Kitchen lactobacillus acidophilus (BACID) TABS tablet Take 1 tablet by mouth daily. PROBIOTIC  . Lecithin 1200 MG CAPS Take 1,200 mg by mouth daily.   . mirtazapine (REMERON) 7.5 MG tablet Take 1 tablet (7.5 mg total) by mouth at bedtime.  . Multiple Vitamin (MULTIVITAMIN WITH MINERALS) TABS tablet Take 1 tablet by mouth daily.  . ondansetron (ZOFRAN) 4 MG tablet TAKE 1 TABLET EVERY 6 HOURS AS  NEEDED FOR NAUSEA  OR VOMITING  . oxybutynin (DITROPAN) 5 MG tablet Take 1 tablet by mouth daily.   . penicillin v potassium (VEETID) 500 MG tablet Take 1 tablet (500 mg total) by mouth 4 (four) times daily.   Facility-Administered Encounter Medications as of 01/23/2016  Medication  . ondansetron (ZOFRAN) 4 mg in sodium chloride 0.9 % 50 mL IVPB      Review of Systems  Constitutional: Negative.   HENT: Negative.        Abscess tooth  Eyes: Negative.   Respiratory: Negative.   Cardiovascular: Negative.   Gastrointestinal: Negative.   Endocrine: Negative.   Genitourinary: Negative.   Musculoskeletal: Negative.   Skin: Negative.   Allergic/Immunologic: Negative.   Neurological: Negative.   Hematological: Negative.   Psychiatric/Behavioral: Negative.        Objective:   Physical Exam  Constitutional: She is oriented to person, place, and time. She appears well-developed and well-nourished.  HENT:  Right lower first molar with inflammation around base and tender to percussion  Cardiovascular: Normal rate, regular rhythm and normal heart sounds.   Pulmonary/Chest: Effort normal and breath sounds normal.  Neurological: She is alert and oriented to person, place, and time.  Psychiatric: She has a normal mood and affect. Her behavior is normal.   BP (!) 152/60 (BP Location: Left Arm)   Pulse 68   Temp 97.4 F (36.3 C) (Oral)   Ht 5' 4"  (1.626 m)   Wt  148 lb (67.1 kg)   BMI 25.40 kg/m         Assessment & Plan:  1. Essential hypertension Systolic pressure is mildly elevated but not enough to warrant change in treatment - CMP14+EGFR - Lipid panel  2. CKD (chronic kidney disease) stage 3, GFR 30-59 ml/min We'll reassess labs today - CMP14+EGFR  3. Low back pain, unspecified back pain laterality, with sciatica presence unspecified Has no complaints with her back  4. Failed total knee, right, subsequent encounter 10 use to ambulate with a walker. Refill baclofen for  muscle spasm around the joint  Wardell Honour MD

## 2016-01-23 NOTE — Patient Instructions (Signed)
Medicare Annual Wellness Visit  [place] and the medical providers at [facility] strive to bring you the best medical care.  In doing so we not only want to address your current medical conditions and concerns but also to detect new conditions early and prevent illness, disease and health-related problems.    Medicare offers a yearly Wellness Visit which allows our clinical staff to assess your need for preventative services including immunizations, lifestyle education, counseling to decrease risk of preventable diseases and screening for fall risk and other medical concerns.    This visit is provided free of charge (no copay) for all Medicare recipients. The clinical pharmacists at [facility] have begun to conduct these Wellness Visits which will also include a thorough review of all your medications.    As you primary medical provider recommend that you make an appointment for your Annual Wellness Visit if you have not done so already this year.  You may set up this appointment before you leave today or you may call back ([phone]) and schedule an appointment.  Please make sure when you call that you mention that you are scheduling your Annual Wellness Visit with the clinical pharmacist so that the appointment may be made for the proper length of time.     Continue current medications. Continue good therapeutic lifestyle changes which include good diet and exercise. Fall precautions discussed with patient. If an FOBT was given today- please return it to our front desk. If you are over 50 years old - you may need Prevnar 13 or the adult Pneumonia vaccine.  **Flu shots are available--- please call and schedule a FLU-CLINIC appointment**  After your visit with us today you will receive a survey in the mail or online from [employer] regarding your care with us. Please take a moment to fill this out. Your feedback is very  important to us as you can help us better understand your patient needs as well as improve your experience and satisfaction. WE CARE ABOUT YOU!!!    

## 2016-02-07 DIAGNOSIS — Z8639 Personal history of other endocrine, nutritional and metabolic disease: Secondary | ICD-10-CM | POA: Diagnosis not present

## 2016-02-07 DIAGNOSIS — Z96652 Presence of left artificial knee joint: Secondary | ICD-10-CM | POA: Diagnosis not present

## 2016-02-07 DIAGNOSIS — Z79899 Other long term (current) drug therapy: Secondary | ICD-10-CM | POA: Diagnosis not present

## 2016-02-07 DIAGNOSIS — Z8249 Family history of ischemic heart disease and other diseases of the circulatory system: Secondary | ICD-10-CM | POA: Diagnosis not present

## 2016-02-07 DIAGNOSIS — M25562 Pain in left knee: Secondary | ICD-10-CM | POA: Diagnosis not present

## 2016-02-07 DIAGNOSIS — M25561 Pain in right knee: Secondary | ICD-10-CM | POA: Diagnosis not present

## 2016-02-07 DIAGNOSIS — I10 Essential (primary) hypertension: Secondary | ICD-10-CM | POA: Diagnosis not present

## 2016-02-16 ENCOUNTER — Telehealth: Payer: Self-pay | Admitting: Family Medicine

## 2016-02-16 MED ORDER — AMOXICILLIN-POT CLAVULANATE 875-125 MG PO TABS: 1.0000 | ORAL_TABLET | Freq: Two times a day (BID) | ORAL | 1 refills | Status: DC

## 2016-02-16 NOTE — Telephone Encounter (Signed)
Okay to refill Augmentin 1. If the infection does not get better she should see the dentist or come back to our office.

## 2016-02-16 NOTE — Telephone Encounter (Signed)
Not sure what medication needs to be refilled

## 2016-02-16 NOTE — Telephone Encounter (Signed)
Patient called stating that she has a abscess tooth and would like a refill on Augmentin.

## 2016-02-16 NOTE — Telephone Encounter (Signed)
Patient aware that medication has been sent to pharmacy 

## 2016-02-25 ENCOUNTER — Other Ambulatory Visit: Payer: Self-pay | Admitting: Family Medicine

## 2016-03-22 ENCOUNTER — Other Ambulatory Visit: Payer: Self-pay | Admitting: Family Medicine

## 2016-05-24 ENCOUNTER — Encounter (INDEPENDENT_AMBULATORY_CARE_PROVIDER_SITE_OTHER): Payer: Self-pay | Admitting: *Deleted

## 2016-05-24 ENCOUNTER — Encounter (INDEPENDENT_AMBULATORY_CARE_PROVIDER_SITE_OTHER): Payer: Self-pay | Admitting: Internal Medicine

## 2016-05-24 ENCOUNTER — Ambulatory Visit (INDEPENDENT_AMBULATORY_CARE_PROVIDER_SITE_OTHER): Payer: Commercial Managed Care - HMO | Admitting: Internal Medicine

## 2016-05-24 ENCOUNTER — Encounter (INDEPENDENT_AMBULATORY_CARE_PROVIDER_SITE_OTHER): Payer: Self-pay

## 2016-05-24 VITALS — BP 160/82 | HR 60 | Temp 98.1°F | Ht 62.0 in | Wt 148.0 lb

## 2016-05-24 DIAGNOSIS — R1310 Dysphagia, unspecified: Secondary | ICD-10-CM | POA: Diagnosis not present

## 2016-05-24 DIAGNOSIS — R1319 Other dysphagia: Secondary | ICD-10-CM
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
## 2016-05-24 NOTE — Patient Instructions (Signed)
DG Esophagram. Continue the Nexium daily.

## 2016-05-26 ENCOUNTER — Telehealth: Payer: Self-pay | Admitting: Family Medicine

## 2016-05-26 MED ORDER — PENICILLIN V POTASSIUM 500 MG PO TABS: 500.0000 mg | ORAL_TABLET | Freq: Four times a day (QID) | ORAL | 0 refills | Status: DC

## 2016-05-26 NOTE — Telephone Encounter (Signed)
If the patient is not allergic to penicillin, please call and Pen-Vee K 500 mg #40 one 4 times a day for infection until completed. If the problems continue she will need to see the dentist.

## 2016-05-26 NOTE — Telephone Encounter (Signed)
Patient states that she has an abscessed tooth and does not have the money to go to the dentist. Patient states it has been there for a week and has not gotten any better. Patient would like antibiotic sent to [facility] in [place]. Covering PCP, please advise and send back to the pools.

## 2016-05-26 NOTE — Telephone Encounter (Signed)
Rx called into pharmacy and patient aware 

## 2016-05-27 ENCOUNTER — Ambulatory Visit: Payer: Commercial Managed Care - HMO | Admitting: Family Medicine

## 2016-05-28 ENCOUNTER — Other Ambulatory Visit (HOSPITAL_COMMUNITY): Payer: Commercial Managed Care - HMO

## 2016-06-11 ENCOUNTER — Other Ambulatory Visit: Payer: Self-pay | Admitting: Family Medicine

## 2016-07-27 ENCOUNTER — Ambulatory Visit: Payer: Commercial Managed Care - HMO | Admitting: Family Medicine

## 2016-09-16 DIAGNOSIS — Z9049 Acquired absence of other specified parts of digestive tract: Secondary | ICD-10-CM | POA: Diagnosis not present

## 2016-09-16 DIAGNOSIS — I509 Heart failure, unspecified: Secondary | ICD-10-CM | POA: Diagnosis not present

## 2016-09-16 DIAGNOSIS — I447 Left bundle-branch block, unspecified: Secondary | ICD-10-CM | POA: Diagnosis not present

## 2016-09-16 DIAGNOSIS — I498 Other specified cardiac arrhythmias: Secondary | ICD-10-CM | POA: Diagnosis not present

## 2016-09-16 DIAGNOSIS — R600 Localized edema: Secondary | ICD-10-CM | POA: Diagnosis not present

## 2016-09-16 DIAGNOSIS — Z9114 Patient's other noncompliance with medication regimen: Secondary | ICD-10-CM | POA: Diagnosis not present

## 2016-09-16 DIAGNOSIS — R8290 Unspecified abnormal findings in urine: Secondary | ICD-10-CM | POA: Diagnosis not present

## 2016-09-16 DIAGNOSIS — Z79899 Other long term (current) drug therapy: Secondary | ICD-10-CM | POA: Diagnosis not present

## 2016-09-16 DIAGNOSIS — I110 Hypertensive heart disease with heart failure: Secondary | ICD-10-CM | POA: Diagnosis not present

## 2016-09-16 DIAGNOSIS — Z881 Allergy status to other antibiotic agents status: Secondary | ICD-10-CM | POA: Diagnosis not present

## 2016-09-16 DIAGNOSIS — Z90710 Acquired absence of both cervix and uterus: Secondary | ICD-10-CM | POA: Diagnosis not present

## 2016-09-16 DIAGNOSIS — Z888 Allergy status to other drugs, medicaments and biological substances status: Secondary | ICD-10-CM | POA: Diagnosis not present

## 2016-09-16 DIAGNOSIS — R0600 Dyspnea, unspecified: Secondary | ICD-10-CM | POA: Diagnosis not present

## 2016-09-16 DIAGNOSIS — E785 Hyperlipidemia, unspecified: Secondary | ICD-10-CM | POA: Diagnosis not present

## 2016-09-23 ENCOUNTER — Ambulatory Visit: Payer: Commercial Managed Care - HMO | Admitting: Pediatrics

## 2016-09-29 DIAGNOSIS — M1712 Unilateral primary osteoarthritis, left knee: Secondary | ICD-10-CM | POA: Diagnosis not present

## 2016-09-29 DIAGNOSIS — M6281 Muscle weakness (generalized): Secondary | ICD-10-CM | POA: Diagnosis not present

## 2016-09-29 DIAGNOSIS — R2689 Other abnormalities of gait and mobility: Secondary | ICD-10-CM | POA: Diagnosis not present

## 2016-09-29 DIAGNOSIS — I110 Hypertensive heart disease with heart failure: Secondary | ICD-10-CM | POA: Diagnosis not present

## 2016-09-29 DIAGNOSIS — M545 Low back pain: Secondary | ICD-10-CM | POA: Diagnosis not present

## 2016-09-29 DIAGNOSIS — M25561 Pain in right knee: Secondary | ICD-10-CM | POA: Diagnosis not present

## 2016-09-29 DIAGNOSIS — K219 Gastro-esophageal reflux disease without esophagitis: Secondary | ICD-10-CM | POA: Diagnosis not present

## 2016-09-29 DIAGNOSIS — E785 Hyperlipidemia, unspecified: Secondary | ICD-10-CM | POA: Diagnosis not present

## 2016-09-29 DIAGNOSIS — I509 Heart failure, unspecified: Secondary | ICD-10-CM | POA: Diagnosis not present

## 2016-10-18 DIAGNOSIS — I110 Hypertensive heart disease with heart failure: Secondary | ICD-10-CM | POA: Diagnosis not present

## 2016-10-18 DIAGNOSIS — M545 Low back pain: Secondary | ICD-10-CM | POA: Diagnosis not present

## 2016-10-18 DIAGNOSIS — K219 Gastro-esophageal reflux disease without esophagitis: Secondary | ICD-10-CM | POA: Diagnosis not present

## 2016-10-18 DIAGNOSIS — M25561 Pain in right knee: Secondary | ICD-10-CM | POA: Diagnosis not present

## 2016-10-18 DIAGNOSIS — M1712 Unilateral primary osteoarthritis, left knee: Secondary | ICD-10-CM | POA: Diagnosis not present

## 2016-10-18 DIAGNOSIS — I509 Heart failure, unspecified: Secondary | ICD-10-CM | POA: Diagnosis not present

## 2016-10-18 DIAGNOSIS — R2689 Other abnormalities of gait and mobility: Secondary | ICD-10-CM | POA: Diagnosis not present

## 2016-10-18 DIAGNOSIS — E785 Hyperlipidemia, unspecified: Secondary | ICD-10-CM | POA: Diagnosis not present

## 2016-10-18 DIAGNOSIS — M6281 Muscle weakness (generalized): Secondary | ICD-10-CM | POA: Diagnosis not present

## 2017-01-02 IMAGING — CT CT HEAD W/O CM
4 of 5 series · 14 of 47 positions shown, 15 images · non-contrast
Comparison: Head CT 03/21/2014

CLINICAL DATA: Head and neck pain for several weeks. No known
injury.

EXAM:
CT HEAD WITHOUT CONTRAST
CT CERVICAL SPINE WITHOUT CONTRAST
TECHNIQUE: Multidetector CT imaging of the head and cervical spine was
performed following the standard protocol without intravenous
contrast. Multiplanar CT image reconstructions of the cervical spine
were also generated.

[Series 2: headseq 4.8 h37s · axial · 0.50mm/px · z∈[+350,+441]mm · 3 of 36 slices shown, 4 images]
[im 9/36  brain]
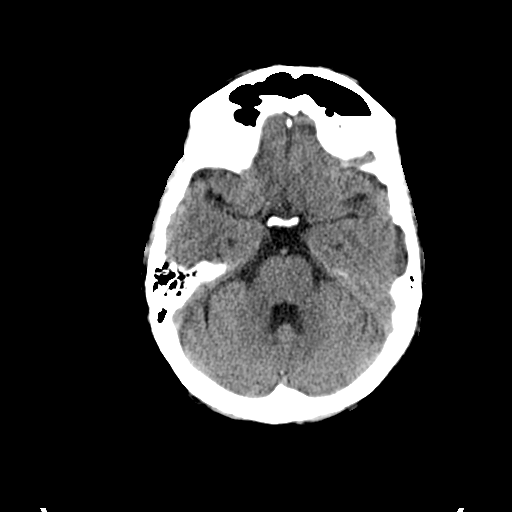
[im 9/36  bone]
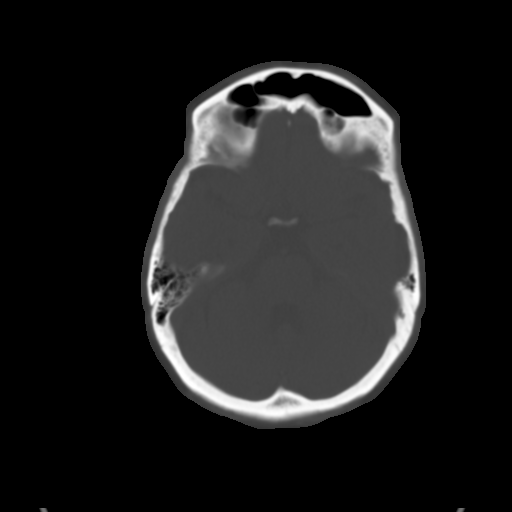
[im 18/36  brain]
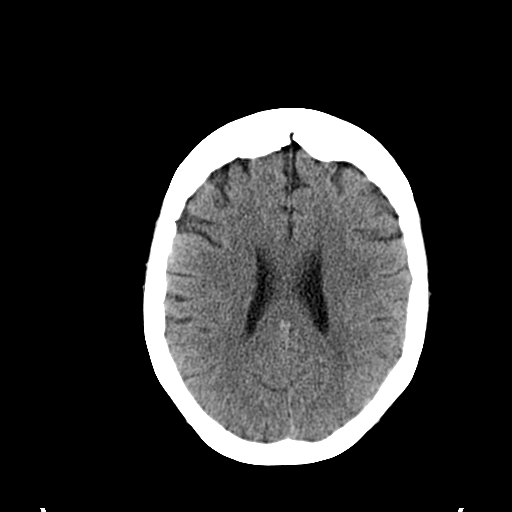
[im 27/36  brain]
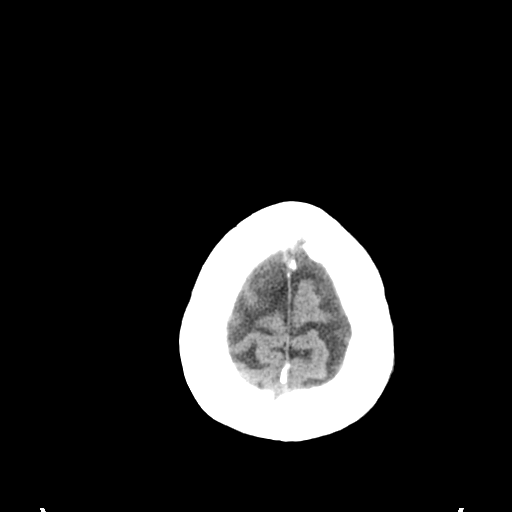

[Series 7: sagittal bone 2.0 · sagittal · 0.21mm/px · 3 of 60 slices shown]
[im 20/60  brain]
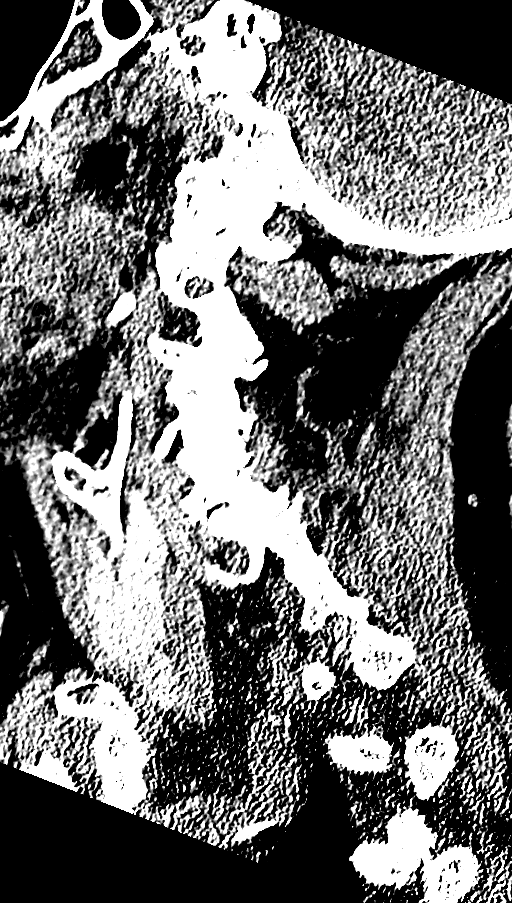
[im 30/60  brain]
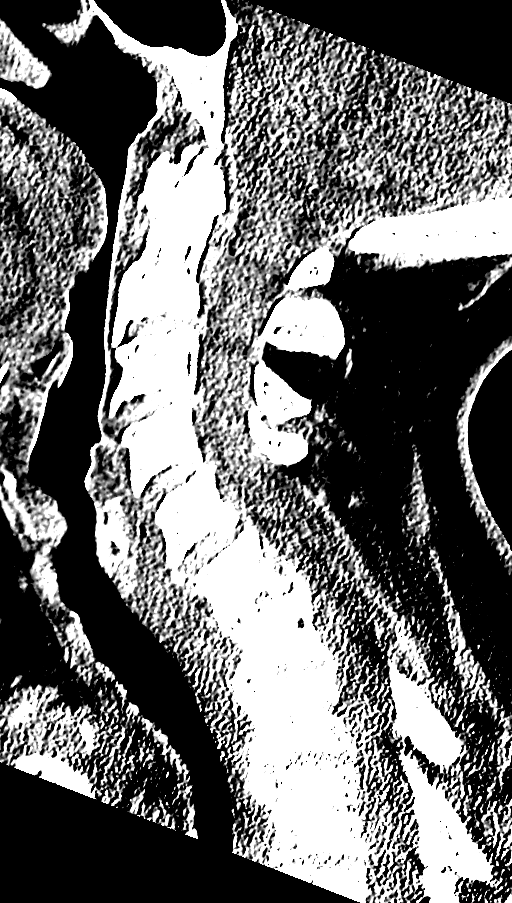
[im 40/60  brain]
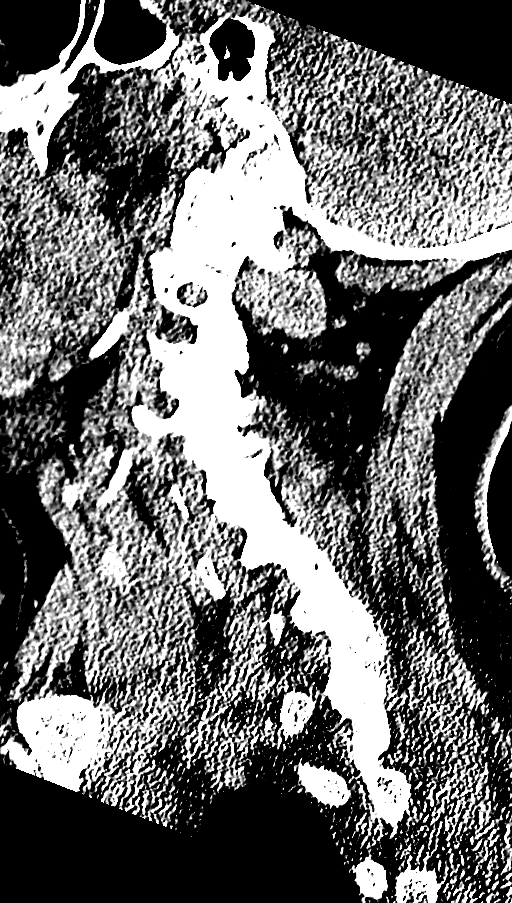

[Series 8: coronal bone 2.0 · coronal · 0.36mm/px · 3 of 52 slices shown]
[im 18/52  brain]
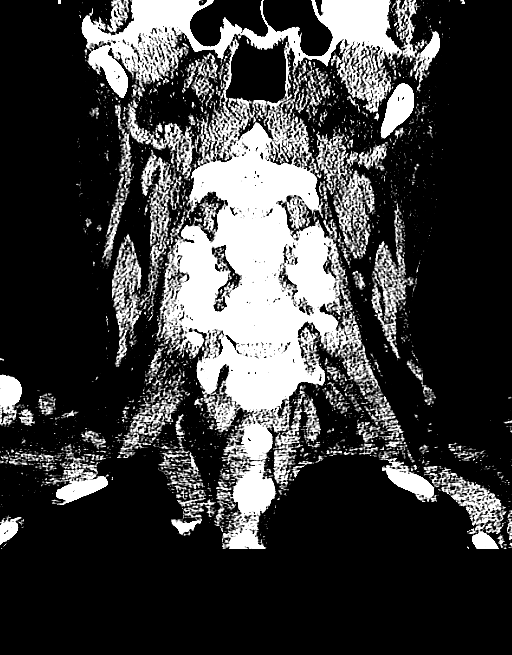
[im 23/52  brain]
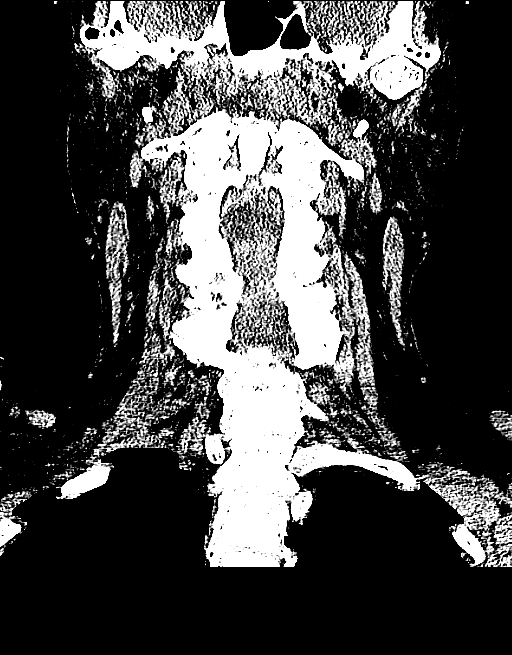
[im 29/52  brain]
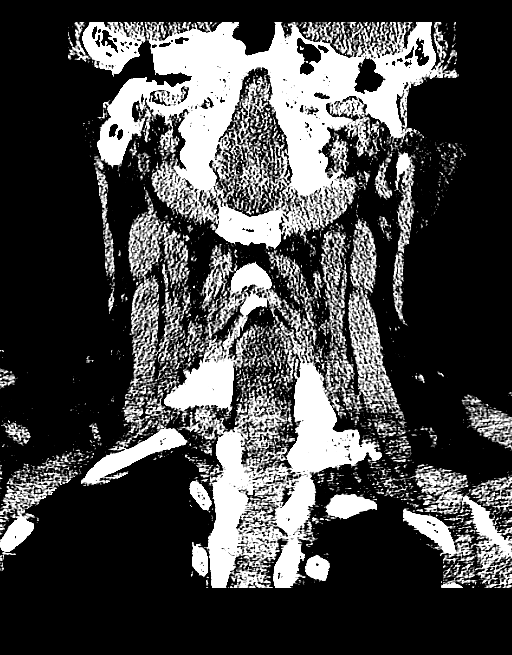

[Series 9: axial bone 2.0 · axial · 0.26mm/px · z∈[+100,+189]mm · 5 of 106 slices shown]
[im 9/106  bone]
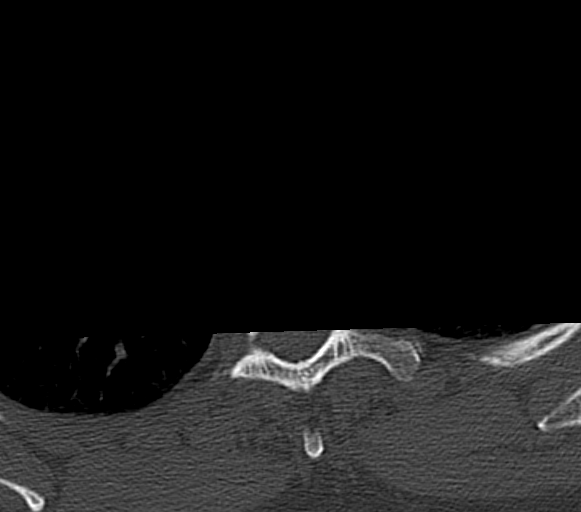
[im 25/106  bone]
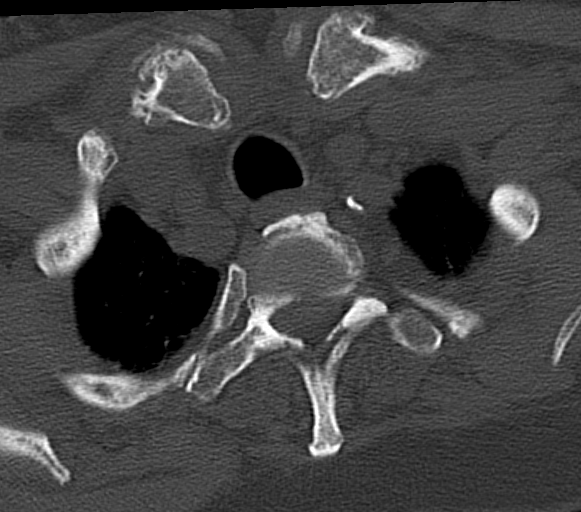
[im 33/106  bone]
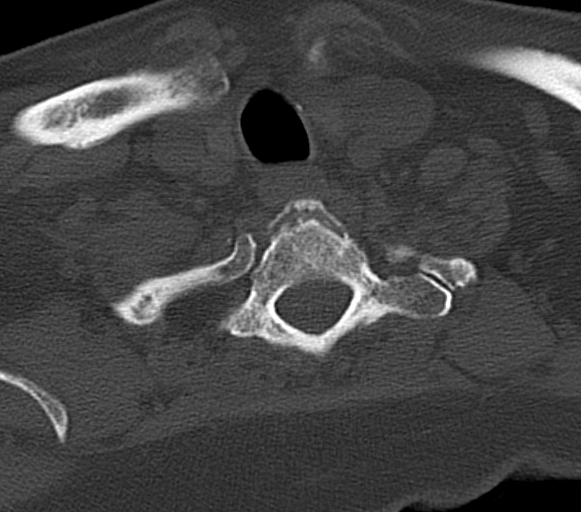
[im 49/106  bone]
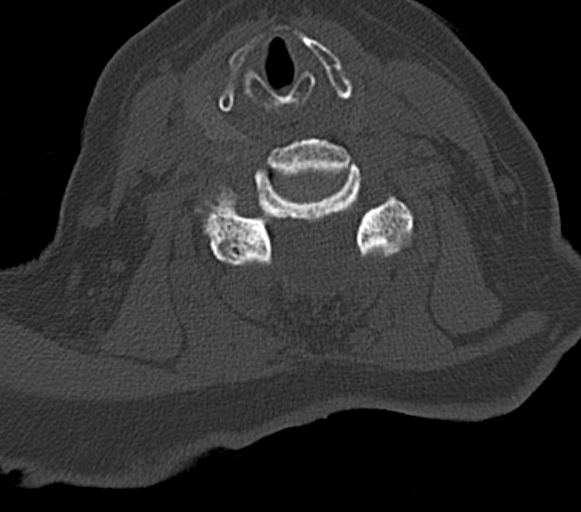
[im 57/106  bone]
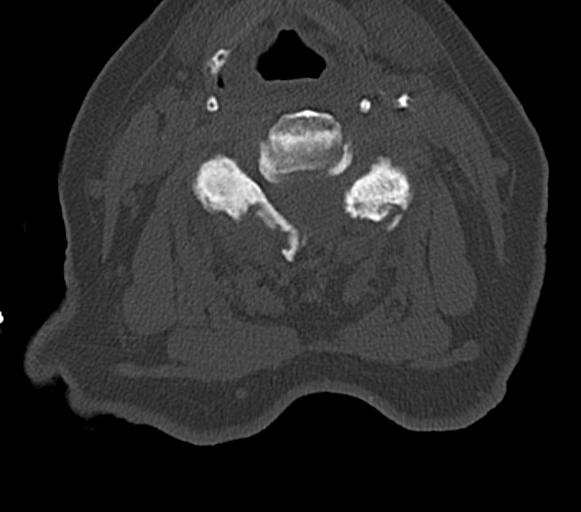

[14 of 47 positions shown; findings below may reference images not displayed]

FINDINGS: CT HEAD FINDINGS

The ventricles are normal in size and configuration. No extra-axial
fluid collections are identified. The gray-white differentiation is
normal. No CT findings for acute intracranial process such as
hemorrhage or infarction. No mass lesions. The brainstem and
cerebellum are grossly normal.

The bony structures are intact. Hyperostosis frontalis interna is
noted. The paranasal sinuses are clear. A left mastoid effusion is
noted. The globes are intact.

CT CERVICAL SPINE FINDINGS

Severe degenerative cervical spondylosis with multilevel disc
disease and facet disease. Degenerative anterior subluxation of C6
on C7 with partial interbody fusion changes. The facets are fused at
this level bilaterally. Prior decompressive surgical changes are
noted from C4-5 to C7-T1. No significant spinal or foraminal
stenosis. Carotid artery calcifications are noted bilaterally.
Advanced sternoclavicular joint degenerative changes. The lung
apices are grossly clear.
IMPRESSION: No acute intracranial findings or mass lesions.

Left mastoid effusion.

Prior surgical changes involving the cervical spine with wide
decompressive laminectomy from C4-5 to C7-T1. Degenerative cervical
spondylosis with multilevel disc disease and facet disease. No acute
bony findings.

## 2017-01-04 IMAGING — DX DG KNEE 1-2V*R*
2 series · 2 of 2 positions shown · non-contrast
Comparison: None.

CLINICAL DATA: Right knee replacement

EXAM:
RIGHT KNEE - 1-2 VIEW

[knee ap]
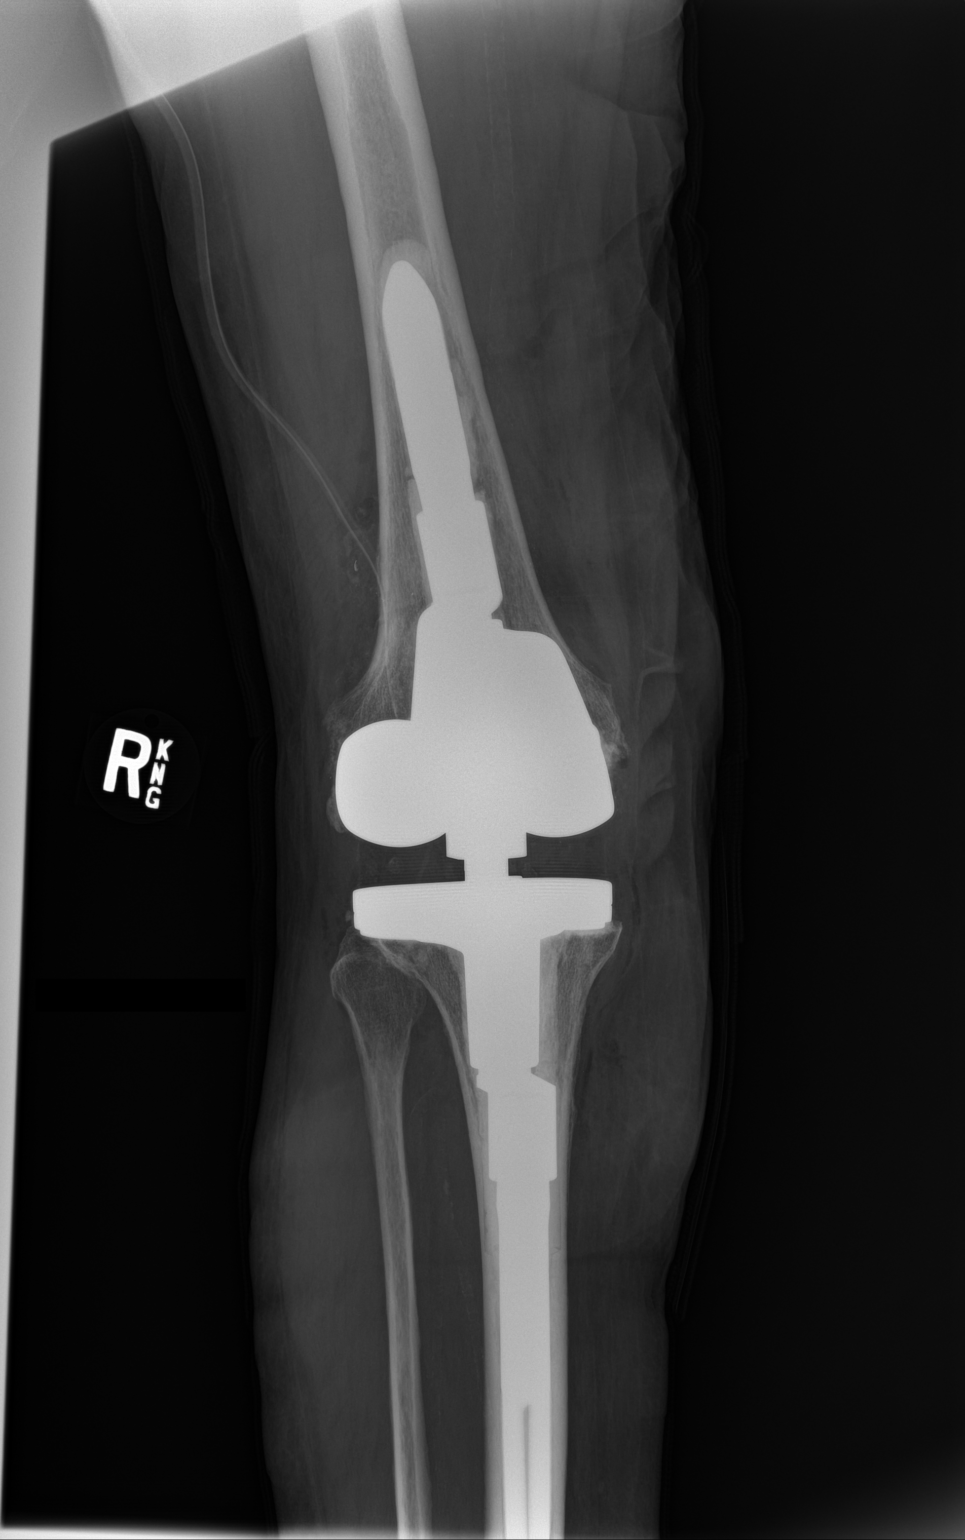

[knee lat]
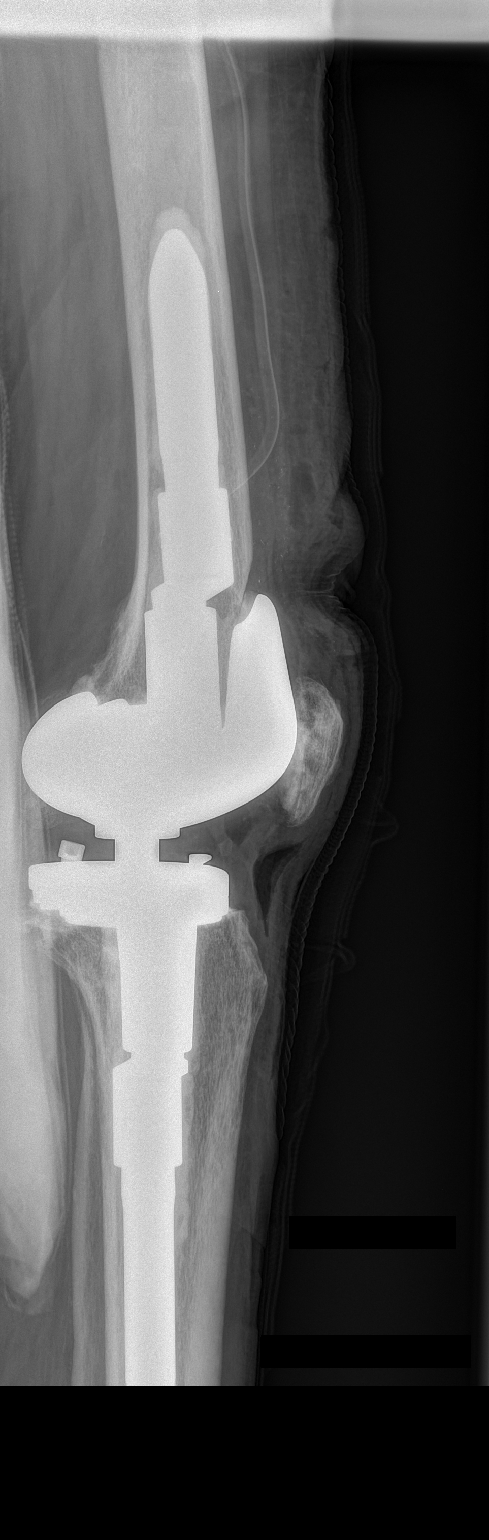

[2 of 2 positions shown; findings below may reference images not displayed]

FINDINGS: Right knee replacement in satisfactory position and alignment. No
fracture or prosthetic malposition. Gas in the joint. Soft tissue
drain noted.
IMPRESSION: Satisfactory right knee replacement.

## 2017-03-22 ENCOUNTER — Encounter: Payer: Self-pay | Admitting: Family Medicine

## 2017-03-22 ENCOUNTER — Ambulatory Visit (INDEPENDENT_AMBULATORY_CARE_PROVIDER_SITE_OTHER): Payer: Medicare HMO | Admitting: Family Medicine

## 2017-03-22 VITALS — BP 132/57 | HR 53 | Temp 96.6°F

## 2017-03-22 DIAGNOSIS — I10 Essential (primary) hypertension: Secondary | ICD-10-CM

## 2017-03-22 DIAGNOSIS — I509 Heart failure, unspecified: Secondary | ICD-10-CM | POA: Insufficient documentation

## 2017-03-22 DIAGNOSIS — Z23 Encounter for immunization: Secondary | ICD-10-CM

## 2017-03-22 DIAGNOSIS — N183 Chronic kidney disease, stage 3 unspecified: Secondary | ICD-10-CM

## 2017-03-22 MED ORDER — ONDANSETRON HCL 4 MG PO TABS: ORAL_TABLET | ORAL | 3 refills | Status: DC

## 2017-03-22 MED ORDER — ATORVASTATIN CALCIUM 20 MG PO TABS: 20.0000 mg | ORAL_TABLET | Freq: Every day | ORAL | 3 refills | Status: DC

## 2017-03-22 MED ORDER — POTASSIUM CHLORIDE ER 10 MEQ PO TBCR: 10.0000 meq | EXTENDED_RELEASE_TABLET | Freq: Two times a day (BID) | ORAL | 3 refills | Status: AC

## 2017-03-22 MED ORDER — METOPROLOL SUCCINATE ER 25 MG PO TB24: 25.0000 mg | ORAL_TABLET | Freq: Every day | ORAL | 3 refills | Status: DC

## 2017-03-22 MED ORDER — BACLOFEN 10 MG PO TABS: 10.0000 mg | ORAL_TABLET | Freq: Every day | ORAL | 2 refills | Status: DC

## 2017-03-22 MED ORDER — AMLODIPINE BESYLATE 10 MG PO TABS: 10.0000 mg | ORAL_TABLET | Freq: Every day | ORAL | 3 refills | Status: DC

## 2017-03-22 MED ORDER — ESOMEPRAZOLE MAGNESIUM 10 MG PO PACK: 10.0000 mg | PACK | Freq: Every day | ORAL | 3 refills | Status: DC

## 2017-03-22 MED ORDER — FUROSEMIDE 20 MG PO TABS: 20.0000 mg | ORAL_TABLET | Freq: Every day | ORAL | 3 refills | Status: DC

## 2017-03-22 NOTE — Progress Notes (Signed)
HPI  Patient presents today here to reestablish care, and for annual physical.  Patient states that she moved away to [facility] for about a year and has come back to town. In the interim she's been hospitalized with new onset CHF, she has good diuresis with 1 Lasix daily. She states that she is breathing normally overall but does have some exertional dyspnea  Patient would like influenza vaccination today.  She has good medication compliance and tolerance. She uses baclofen approximately 1 pill once daily at night to help with sleep and muscle spasms. She uses Zofran for occasional nausea   PMH: Smoking status noted ROS: Per HPI  Objective: BP (!) 132/57   Pulse (!) 53   Temp (!) 96.6 F (35.9 C) (Oral)   SpO2 97%  Gen: NAD, alert, cooperative with exam HEENT: NCAT, EOMI, PERRL, arcus senilis  CV: RRR, good S1/S2, no murmur Resp: CTABL, no wheezes, non-labored Abd: SNTND, BS present, no guarding or organomegaly Ext: No edema, warm Neuro: Alert and oriented, patient sitting in a wheelchair, EOMI  Assessment and plan:  # Annual physical exam Normal exam overall, no extensive neuro testing but she is in a wheelchair today, reports poor use of R knee after repeat replacement  Labs Records from Hospitalization requested.  Influenza vaccine given - counseling provided for all vaccine components  # CHF Appears euvolemic, lungs are clear. Patient with some exertional dyspnea which appears stable for many months. Good diuresis of Lasix, no change Repeat labs Refill meds-metoprolol, Lasix, KCl, statin  # Chronic kidney disease Repeat labs, asymptomatic Follow-up 3 months for repeat labs considering Lasix and KCl use  # Hypertension Well-controlled on current medications, no changes  patient does appear to be beta blocked adequately given rate in the 50s     Orders Placed This Encounter  Procedures  . Flu vaccine HIGH DOSE PF  . CMP14+EGFR  . CBC with  Differential/Platelet  . Lipid panel  . TSH    Meds ordered this encounter  Medications  . DISCONTD: atorvastatin (LIPITOR) 20 MG tablet    Sig: Take 1 tablet by mouth at bedtime.    Refill:  3  . DISCONTD: baclofen (LIORESAL) 10 MG tablet    Sig: Take 1 tablet by mouth daily.    Refill:  2  . DISCONTD: metoprolol succinate (TOPROL-XL) 25 MG 24 hr tablet    Sig: Take 1 tablet by mouth daily.    Refill:  3  . DISCONTD: amLODipine (NORVASC) 10 MG tablet    Sig: Take 1 tablet by mouth daily.    Refill:  3  . DISCONTD: potassium chloride (K-DUR) 10 MEQ tablet    Sig: Take 1 tablet by mouth 2 (two) times daily.    Refill:  3  . DISCONTD: furosemide (LASIX) 20 MG tablet    Sig: Take 1 tablet by mouth daily.    Refill:  0  . DISCONTD: esomeprazole (NEXIUM) 10 MG packet    Sig: Take 10 mg by mouth daily before breakfast.  . amLODipine (NORVASC) 10 MG tablet    Sig: Take 1 tablet (10 mg total) by mouth daily.    Dispense:  90 tablet    Refill:  3  . atorvastatin (LIPITOR) 20 MG tablet    Sig: Take 1 tablet (20 mg total) by mouth at bedtime.    Dispense:  90 tablet    Refill:  3  . baclofen (LIORESAL) 10 MG tablet    Sig: Take 1  tablet (10 mg total) by mouth daily.    Dispense:  30 each    Refill:  2  . esomeprazole (NEXIUM) 10 MG packet    Sig: Take 10 mg by mouth daily before breakfast.    Dispense:  90 each    Refill:  3  . furosemide (LASIX) 20 MG tablet    Sig: Take 1 tablet (20 mg total) by mouth daily.    Dispense:  90 tablet    Refill:  3  . metoprolol succinate (TOPROL-XL) 25 MG 24 hr tablet    Sig: Take 1 tablet (25 mg total) by mouth daily.    Dispense:  90 tablet    Refill:  3  . ondansetron (ZOFRAN) 4 MG tablet    Sig: TAKE 1 TABLET EVERY 6 HOURS AS NEEDED FOR NAUSEA  OR VOMITING    Dispense:  30 tablet    Refill:  3  . potassium chloride (K-DUR) 10 MEQ tablet    Sig: Take 1 tablet (10 mEq total) by mouth 2 (two) times daily.    Dispense:  180 tablet     Refill:  [place], MD [place] 03/22/2017, 11:46 AM

## 2017-03-22 NOTE — Patient Instructions (Addendum)
Great to see you!  Come back in 3-4 months unless you need us sooner.    

## 2017-03-23 ENCOUNTER — Telehealth: Payer: Self-pay | Admitting: Family Medicine

## 2017-03-23 LAB — CBC WITH DIFFERENTIAL/PLATELET
Basophils Absolute: 0 10*3/uL (ref 0.0–0.2)
Basos: 0 %
EOS (ABSOLUTE): 0.1 10*3/uL (ref 0.0–0.4)
EOS: 1 %
HEMATOCRIT: 39.8 % (ref 34.0–46.6)
Hemoglobin: 13.2 g/dL (ref 11.1–15.9)
Immature Grans (Abs): 0 10*3/uL (ref 0.0–0.1)
Immature Granulocytes: 0 %
LYMPHS ABS: 2.2 10*3/uL (ref 0.7–3.1)
Lymphs: 32 %
MCH: 30 pg (ref 26.6–33.0)
MCHC: 33.2 g/dL (ref 31.5–35.7)
MCV: 91 fL (ref 79–97)
MONOS ABS: 0.5 10*3/uL (ref 0.1–0.9)
Monocytes: 7 %
Neutrophils Absolute: 4.1 10*3/uL (ref 1.4–7.0)
Neutrophils: 60 %
Platelets: 180 10*3/uL (ref 150–379)
RBC: 4.4 x10E6/uL (ref 3.77–5.28)
RDW: 14.4 % (ref 12.3–15.4)
WBC: 6.9 10*3/uL (ref 3.4–10.8)

## 2017-03-23 LAB — LIPID PANEL
CHOL/HDL RATIO: 3.5 ratio (ref 0.0–4.4)
Cholesterol, Total: 138 mg/dL (ref 100–199)
HDL: 39 mg/dL — ABNORMAL LOW (ref >39)
LDL Calculated: 74 mg/dL (ref 0–99)
TRIGLYCERIDES: 127 mg/dL (ref 0–149)
VLDL Cholesterol Cal: 25 mg/dL (ref 5–40)

## 2017-03-23 LAB — CMP14+EGFR
A/G RATIO: 1.9 (ref 1.2–2.2)
ALBUMIN: 4 g/dL (ref 3.5–4.8)
ALK PHOS: 58 IU/L (ref 39–117)
ALT: 7 IU/L (ref 0–32)
AST: 18 IU/L (ref 0–40)
BILIRUBIN TOTAL: 0.4 mg/dL (ref 0.0–1.2)
BUN / CREAT RATIO: 9 — AB (ref 12–28)
BUN: 10 mg/dL (ref 8–27)
CO2: 25 mmol/L (ref 20–29)
CREATININE: 1.08 mg/dL — AB (ref 0.57–1.00)
Calcium: 9.3 mg/dL (ref 8.7–10.3)
Chloride: 104 mmol/L (ref 96–106)
GFR calc Af Amer: 57 mL/min/{1.73_m2} — ABNORMAL LOW (ref >59)
GFR calc non Af Amer: 50 mL/min/{1.73_m2} — ABNORMAL LOW (ref >59)
GLOBULIN, TOTAL: 2.1 g/dL (ref 1.5–4.5)
Glucose: 82 mg/dL (ref 65–99)
POTASSIUM: 4.3 mmol/L (ref 3.5–5.2)
SODIUM: 144 mmol/L (ref 134–144)
Total Protein: 6.1 g/dL (ref 6.0–8.5)

## 2017-03-23 LAB — TSH: TSH: 2.46 u[IU]/mL (ref 0.450–4.500)

## 2017-03-23 NOTE — Telephone Encounter (Signed)
Aware. 

## 2017-03-23 NOTE — Telephone Encounter (Signed)
Returned call

## 2017-05-06 ENCOUNTER — Emergency Department (HOSPITAL_COMMUNITY): Payer: Medicare HMO

## 2017-05-06 ENCOUNTER — Encounter (HOSPITAL_COMMUNITY): Payer: Self-pay | Admitting: Emergency Medicine

## 2017-05-06 ENCOUNTER — Emergency Department (HOSPITAL_COMMUNITY)
Admission: EM | Admit: 2017-05-06 | Discharge: 2017-05-06 | Disposition: A | Discharge status: Home / Self Care | Payer: Medicare HMO | Attending: Emergency Medicine | Admitting: Emergency Medicine

## 2017-05-06 ENCOUNTER — Other Ambulatory Visit: Payer: Self-pay

## 2017-05-06 DIAGNOSIS — R0789 Other chest pain: Secondary | ICD-10-CM | POA: Insufficient documentation

## 2017-05-06 DIAGNOSIS — Z79899 Other long term (current) drug therapy: Secondary | ICD-10-CM | POA: Diagnosis not present

## 2017-05-06 DIAGNOSIS — R0781 Pleurodynia: Secondary | ICD-10-CM | POA: Diagnosis not present

## 2017-05-06 DIAGNOSIS — I509 Heart failure, unspecified: Secondary | ICD-10-CM | POA: Insufficient documentation

## 2017-05-06 DIAGNOSIS — R0782 Intercostal pain: Secondary | ICD-10-CM | POA: Diagnosis not present

## 2017-05-06 DIAGNOSIS — N183 Chronic kidney disease, stage 3 (moderate): Secondary | ICD-10-CM | POA: Insufficient documentation

## 2017-05-06 DIAGNOSIS — I130 Hypertensive heart and chronic kidney disease with heart failure and stage 1 through stage 4 chronic kidney disease, or unspecified chronic kidney disease: Secondary | ICD-10-CM | POA: Insufficient documentation

## 2017-05-06 HISTORY — DX: Heart failure, unspecified: I50.9

## 2017-05-06 MED ORDER — HYDROCODONE-ACETAMINOPHEN 5-325 MG PO TABS
1.0000 | ORAL_TABLET | Freq: Once | ORAL | Status: AC
  Administered 2017-05-06: 1 via ORAL
  Filled 2017-05-06: qty 1

## 2017-05-06 MED ORDER — HYDROCODONE-ACETAMINOPHEN 5-325 MG PO TABS: 1.0000 | ORAL_TABLET | ORAL | 0 refills | Status: AC | PRN

## 2017-05-06 NOTE — Discharge Instructions (Signed)
X-ray shows no broken rib or changes in your lung.  Will prescribe pain medicine.  Follow-up your primary care doctor.

## 2017-05-06 NOTE — ED Notes (Signed)
Pt reports shge fell 2 weeks ago,o thought she was fine  Awakened this am with R lower back pain and a knot to her back  Reports she took baclofen 3 hours ago

## 2017-05-06 NOTE — ED Triage Notes (Signed)
Pt c/o lower back pain since waking this am. Pt denies any injury. Pt usually walks with walker.
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
## 2017-05-06 NOTE — ED Notes (Signed)
To radiology

## 2017-05-09 ENCOUNTER — Ambulatory Visit (INDEPENDENT_AMBULATORY_CARE_PROVIDER_SITE_OTHER): Payer: Medicare HMO | Admitting: Family Medicine

## 2017-05-09 ENCOUNTER — Encounter: Payer: Self-pay | Admitting: Family Medicine

## 2017-05-09 VITALS — BP 128/58 | HR 57 | Temp 97.0°F

## 2017-05-09 DIAGNOSIS — F39 Unspecified mood [affective] disorder: Secondary | ICD-10-CM | POA: Diagnosis not present

## 2017-05-09 DIAGNOSIS — I509 Heart failure, unspecified: Secondary | ICD-10-CM | POA: Diagnosis not present

## 2017-05-09 DIAGNOSIS — K047 Periapical abscess without sinus: Secondary | ICD-10-CM | POA: Diagnosis not present

## 2017-05-09 MED ORDER — PENICILLIN V POTASSIUM 500 MG PO TABS: 500.0000 mg | ORAL_TABLET | Freq: Four times a day (QID) | ORAL | 0 refills | Status: DC

## 2017-05-09 MED ORDER — PAROXETINE HCL 20 MG PO TABS: 20.0000 mg | ORAL_TABLET | Freq: Every day | ORAL | 3 refills | Status: DC

## 2017-05-09 NOTE — Progress Notes (Signed)
   HPI  Patient presents today here for follow-up of abscessed tooth as well as to discuss mood disorder.  Patient also requests letter stating that her daughter needs to stay with her due to medical problems.  She states that she falls frequently despite using a walker and her daughter is there to help her.  Anxiety, depression Patient does have some symptoms of depression, as well as anxiety.  She states that she has difficulty sleeping, she has little energy, she has trouble concentrating, and appetite change.  She also endorses feeling depressed and anhedonia.  The patient states that she has had this abscess tooth on the upper right side for several weeks.  She would like an antibiotic. This has been treated before She cannot afford to go to the dentist, she is looking for a low income dentists.  PMH: Smoking status noted ROS: Per HPI  Objective: BP (!) 128/58   Pulse (!) 57   Temp (!) 97 F (36.1 C) (Oral)  Gen: NAD, alert, cooperative with exam HEENT: NCAT, right upper molar appears to have severe [place]itie CV: RRR, good S1/S2, no murmur Resp: CTABL, no wheezes, non-labored Ext: No edema, warm Neuro: Alert and oriented, No gross deficits  Depression screen [facility] 2/9 05/09/2017 03/22/2017 01/23/2016 07/25/2015 12/09/2014  Decreased Interest 3 1 0 0 0  Down, Depressed, Hopeless 3 2 0 0 0  PHQ - 2 Score 6 3 0 0 0  Altered sleeping 2 1 - - -  Tired, decreased energy 2 3 - - -  Change in appetite 3 3 - - -  Feeling bad or failure about yourself  0 0 - - -  Trouble concentrating 3 0 - - -  Moving slowly or fidgety/restless 0 0 - - -  Suicidal thoughts 0 0 - - -  PHQ-9 Score 16 10 - - -     Assessment and plan:  #Disorder Mixed anxiety and depression Starting Paxil, she is done well with this previously, consider also Remeron.   #Abscessed tooth Right upper molar, Pen-V K given Discussed the importance of definitive treatment  CHF Patient states she was diagnosed with  CHF in the emergency room in [facility].  She states that she was worked up with an echocardiogram and chest x-ray. She has been on Lasix since and has good diuresis. She is breathing normally today  Meds ordered this encounter  Medications  . penicillin v potassium (VEETID) 500 MG tablet    Sig: Take 1 tablet (500 mg total) 4 (four) times daily by mouth.    Dispense:  40 tablet    Refill:  0  . PARoxetine (PAXIL) 20 MG tablet    Sig: Take 1 tablet (20 mg total) daily by mouth.    Dispense:  90 tablet    Refill:  [place], MD [place] 05/09/2017, 11:25 AM

## 2017-05-09 NOTE — Patient Instructions (Signed)
Great to see you!   Dental Abscess A dental abscess is pus in or around a tooth. Follow these instructions at home:  Take medicines only as told by your dentist.  If you were prescribed antibiotic medicine, finish all of it even if you start to feel better.  Rinse your mouth (gargle) often with salt water.  Do not drive or use heavy machinery, like a lawn mower, while taking pain medicine.  Do not apply heat to the outside of your mouth.  Keep all follow-up visits as told by your dentist. This is important. Contact a doctor if:  Your pain is worse, and medicine does not help. Get help right away if:  You have a fever or chills.  Your symptoms suddenly get worse.  You have a very bad headache.  You have problems breathing or swallowing.  You have trouble opening your mouth.  You have puffiness (swelling) in your neck or around your eye. This information is not intended to replace advice given to you by your health care provider. Make sure you discuss any questions you have with your health care provider. Document Released: 10/22/2014 Document Revised: 11/13/2015 Document Reviewed: 06/04/2014 Elsevier Interactive Patient Education  [employer].

## 2017-05-18 ENCOUNTER — Telehealth: Payer: Self-pay | Admitting: Family Medicine

## 2017-05-18 MED ORDER — CITALOPRAM HYDROBROMIDE 10 MG PO TABS: 10.0000 mg | ORAL_TABLET | Freq: Every day | ORAL | 1 refills | Status: DC

## 2017-05-18 NOTE — Telephone Encounter (Signed)
Patient states that she wants to d/c paxil and start celexa. Rx sent in.

## 2017-05-18 NOTE — Telephone Encounter (Signed)
Look at previous phone note 

## 2017-05-18 NOTE — Telephone Encounter (Signed)
Patient reporting GI upset with starting Paxil.  This is a common side effect and usually gets better with taking it, it also is better tolerated if started slower.   Will recommend cutting tablet in half and taking that for 2 weeks then increasing to a full tablet.  I am glad to send Celexa instead if she thinks this will still be a problem.  Laroy Apple, MD [facility] 05/18/2017, 12:20 PM

## 2017-05-18 NOTE — Telephone Encounter (Signed)
lmtcb

## 2017-05-18 NOTE — Telephone Encounter (Signed)
Will you please review and advise

## 2017-05-18 NOTE — Addendum Note (Signed)
Addended by: Karle Plumber on: 05/18/2017 03:33 PM   Modules accepted: Orders

## 2017-06-09 DIAGNOSIS — N329 Bladder disorder, unspecified: Secondary | ICD-10-CM | POA: Diagnosis not present

## 2017-06-09 DIAGNOSIS — M79662 Pain in left lower leg: Secondary | ICD-10-CM | POA: Diagnosis not present

## 2017-06-09 DIAGNOSIS — K219 Gastro-esophageal reflux disease without esophagitis: Secondary | ICD-10-CM | POA: Diagnosis not present

## 2017-06-09 DIAGNOSIS — R609 Edema, unspecified: Secondary | ICD-10-CM | POA: Diagnosis not present

## 2017-06-09 DIAGNOSIS — I110 Hypertensive heart disease with heart failure: Secondary | ICD-10-CM | POA: Diagnosis not present

## 2017-06-09 DIAGNOSIS — M109 Gout, unspecified: Secondary | ICD-10-CM | POA: Diagnosis not present

## 2017-06-09 DIAGNOSIS — R600 Localized edema: Secondary | ICD-10-CM | POA: Diagnosis not present

## 2017-06-09 DIAGNOSIS — I509 Heart failure, unspecified: Secondary | ICD-10-CM | POA: Diagnosis not present

## 2017-06-09 DIAGNOSIS — G8929 Other chronic pain: Secondary | ICD-10-CM | POA: Diagnosis not present

## 2017-06-09 DIAGNOSIS — I447 Left bundle-branch block, unspecified: Secondary | ICD-10-CM | POA: Diagnosis not present

## 2017-06-09 DIAGNOSIS — E7800 Pure hypercholesterolemia, unspecified: Secondary | ICD-10-CM | POA: Diagnosis not present

## 2017-06-09 DIAGNOSIS — M549 Dorsalgia, unspecified: Secondary | ICD-10-CM | POA: Diagnosis not present

## 2017-06-10 ENCOUNTER — Telehealth: Payer: Self-pay | Admitting: Family Medicine

## 2017-06-10 MED ORDER — AMOXICILLIN 500 MG PO CAPS: 500.0000 mg | ORAL_CAPSULE | Freq: Three times a day (TID) | ORAL | 0 refills | Status: DC

## 2017-06-10 NOTE — Telephone Encounter (Signed)
Pt has some swelling with pain from abscessed tooth Requesting antibiotic Pt is out of town visiting sister Please review and advise

## 2017-06-10 NOTE — Telephone Encounter (Signed)
Amoxicillin sent to [facility] for abscessed tooth.  Laroy Apple, MD [facility] 06/10/2017, 2:55 PM

## 2017-06-10 NOTE — Telephone Encounter (Signed)
What symptoms do you have? Abcess tooth Is visiting sister in [place]  How long have you been sick? 2-3 days  Have you been seen for this problem? NO  If your provider decides to give you a prescription, which pharmacy would you like for it to be sent to? [facility] in [place] [phone]   Patient informed that this information will be sent to the clinical staff for review and that they should receive a follow up call.

## 2017-06-10 NOTE — Addendum Note (Signed)
Addended by: Wardell Heath on: 06/10/2017 03:02 PM   Modules accepted: Orders

## 2017-07-04 ENCOUNTER — Ambulatory Visit: Payer: Medicare HMO | Admitting: Family Medicine

## 2017-07-25 ENCOUNTER — Ambulatory Visit: Payer: Medicare HMO | Admitting: Family Medicine

## 2017-07-28 ENCOUNTER — Other Ambulatory Visit: Payer: Self-pay | Admitting: Family Medicine

## 2017-08-02 ENCOUNTER — Telehealth: Payer: Self-pay | Admitting: *Deleted

## 2017-08-02 MED ORDER — ESOMEPRAZOLE MAGNESIUM 10 MG PO PACK: 10.0000 mg | PACK | Freq: Every day | ORAL | 0 refills | Status: DC

## 2017-08-02 NOTE — Telephone Encounter (Signed)
Pt stated that [employer] was to send request to RF magnesium Medication is not on pts current med list Pt says just wait for [employer] to send request again

## 2017-08-02 NOTE — Addendum Note (Signed)
Addended by: Antonietta Barcelona D on: 08/02/2017 04:32 PM   Modules accepted: Orders

## 2017-08-08 ENCOUNTER — Telehealth: Payer: Self-pay | Admitting: Family Medicine

## 2017-08-08 NOTE — Telephone Encounter (Signed)
Done on 08/02/17

## 2017-09-13 ENCOUNTER — Telehealth: Payer: Self-pay | Admitting: Family Medicine

## 2017-09-13 NOTE — Telephone Encounter (Signed)
Spoke with patient and informed her the Nexium was refilled on 08/02/17 with a 90 day supply.  She reports she has not received this and she will contact her pharmacy.  Will call back if any problems.

## 2017-09-15 ENCOUNTER — Other Ambulatory Visit: Payer: Self-pay | Admitting: *Deleted

## 2017-09-15 MED ORDER — PANTOPRAZOLE SODIUM 40 MG PO TBEC: 40.0000 mg | DELAYED_RELEASE_TABLET | Freq: Every day | ORAL | 3 refills | Status: AC

## 2017-09-15 NOTE — Telephone Encounter (Signed)
Pt aware.

## 2017-09-15 NOTE — Telephone Encounter (Signed)
Changed PPI to protonix given cost of previous medication.  Laroy Apple, MD [facility] 09/15/2017, 12:05 PM

## 2017-09-15 NOTE — Telephone Encounter (Signed)
[employer] did received refill on Esomeprazole in February but it was $800 so order was cancelled Alternatives are Pantoprazole or Omeprazole Please advise and inform pt as well

## 2017-09-26 ENCOUNTER — Ambulatory Visit: Payer: Medicare HMO | Admitting: Family Medicine

## 2017-10-25 ENCOUNTER — Encounter: Payer: Self-pay | Admitting: Family Medicine

## 2017-10-25 ENCOUNTER — Ambulatory Visit (INDEPENDENT_AMBULATORY_CARE_PROVIDER_SITE_OTHER): Payer: Medicare HMO | Admitting: Family Medicine

## 2017-10-25 VITALS — BP 158/65 | HR 55 | Temp 97.2°F

## 2017-10-25 DIAGNOSIS — K047 Periapical abscess without sinus: Secondary | ICD-10-CM | POA: Diagnosis not present

## 2017-10-25 DIAGNOSIS — I10 Essential (primary) hypertension: Secondary | ICD-10-CM | POA: Diagnosis not present

## 2017-10-25 DIAGNOSIS — F39 Unspecified mood [affective] disorder: Secondary | ICD-10-CM | POA: Diagnosis not present

## 2017-10-25 MED ORDER — PENICILLIN V POTASSIUM 500 MG PO TABS: 500.0000 mg | ORAL_TABLET | Freq: Four times a day (QID) | ORAL | 0 refills | Status: DC

## 2017-10-25 MED ORDER — MIRTAZAPINE 15 MG PO TABS: 15.0000 mg | ORAL_TABLET | Freq: Every day | ORAL | 3 refills | Status: AC

## 2017-10-25 NOTE — Progress Notes (Signed)
   HPI  Patient presents today here for follow-up.  Depression Patient has tried Celexa and Paxil with no improvement.  She states she is tried Paxil for a few months. No SI. He says she does not always sleep well, his low appetite, she has increased crying spells.  Hypertension Good medication compliance and tolerance. No headache or chest pain. Does have chest tightness when she is anxious sometimes  Asking antibiotics for pain in her mouth, she does not have the money to have teeth pulled. States she has a painful area around tooth on the right lower gum  PMH: Smoking status noted ROS: Per HPI  Objective: BP (!) 158/65   Pulse (!) 55   Temp (!) 97.2 F (36.2 C) (Oral)  Gen: NAD, alert, cooperative with exam HEENT: NCAT, poor dentition, oropharynx clear otherwise CV: RRR, good S1/S2, no murmur Resp: CTABL, no wheezes, non-labored  Ext: No edema, warm Neuro: Alert and oriented, No gross deficits  Assessment and plan:  #Abscessed tooth Patient with multiple teeth of concern, penicillin Discussed dentistry  #Mood disorder, depression Discontinue Paxil, she is on 20 mg so she can simply stop, starting Remeron 1 pill once a day at night Follow-up 3 to 4 weeks  #Hypertension Slightly elevated, however considering her age this is not terribly and appropriate, continue to follow closely Continue current medications Labs next visit in 3 to 4 weeks    Meds ordered this encounter  Medications  . mirtazapine (REMERON) 15 MG tablet    Sig: Take 1 tablet (15 mg total) by mouth at bedtime.    Dispense:  30 tablet    Refill:  3  . penicillin v potassium (VEETID) 500 MG tablet    Sig: Take 1 tablet (500 mg total) by mouth 4 (four) times daily.    Dispense:  120 tablet    Refill:  0    Laroy Apple, MD [facility] 10/25/2017, 10:04 AM

## 2017-11-19 ENCOUNTER — Other Ambulatory Visit: Payer: Self-pay | Admitting: Family Medicine

## 2017-11-21 NOTE — Telephone Encounter (Signed)
Last seen 10/25/17  Dr Bradshaw 

## 2017-11-24 ENCOUNTER — Ambulatory Visit: Payer: Medicare HMO | Admitting: Family Medicine

## 2017-12-05 ENCOUNTER — Other Ambulatory Visit: Payer: Self-pay | Admitting: Family Medicine

## 2017-12-14 ENCOUNTER — Other Ambulatory Visit: Payer: Self-pay | Admitting: Family Medicine

## 2017-12-15 ENCOUNTER — Other Ambulatory Visit: Payer: Self-pay | Admitting: *Deleted

## 2017-12-15 MED ORDER — ESOMEPRAZOLE MAGNESIUM 10 MG PO PACK: 10.0000 mg | PACK | Freq: Every day | ORAL | 0 refills | Status: AC

## 2017-12-15 NOTE — Telephone Encounter (Signed)
Last seen 10/25/17  Dr Wendi Snipes

## 2018-04-13 DIAGNOSIS — Z7982 Long term (current) use of aspirin: Secondary | ICD-10-CM | POA: Diagnosis not present

## 2018-04-13 DIAGNOSIS — M109 Gout, unspecified: Secondary | ICD-10-CM | POA: Diagnosis not present

## 2018-04-13 DIAGNOSIS — M25432 Effusion, left wrist: Secondary | ICD-10-CM | POA: Diagnosis not present

## 2018-04-13 DIAGNOSIS — Z79899 Other long term (current) drug therapy: Secondary | ICD-10-CM | POA: Diagnosis not present

## 2018-04-13 DIAGNOSIS — I10 Essential (primary) hypertension: Secondary | ICD-10-CM | POA: Diagnosis not present

## 2018-04-13 DIAGNOSIS — K219 Gastro-esophageal reflux disease without esophagitis: Secondary | ICD-10-CM | POA: Diagnosis not present

## 2018-04-13 DIAGNOSIS — M25532 Pain in left wrist: Secondary | ICD-10-CM | POA: Diagnosis not present

## 2018-04-13 DIAGNOSIS — L03114 Cellulitis of left upper limb: Secondary | ICD-10-CM | POA: Diagnosis not present

## 2018-04-13 DIAGNOSIS — M19032 Primary osteoarthritis, left wrist: Secondary | ICD-10-CM | POA: Diagnosis not present

## 2018-04-13 DIAGNOSIS — Z96651 Presence of right artificial knee joint: Secondary | ICD-10-CM | POA: Diagnosis not present

## 2018-04-13 DIAGNOSIS — E7800 Pure hypercholesterolemia, unspecified: Secondary | ICD-10-CM | POA: Diagnosis not present

## 2018-04-13 DIAGNOSIS — M7989 Other specified soft tissue disorders: Secondary | ICD-10-CM | POA: Diagnosis not present

## 2019-05-16 ENCOUNTER — Encounter (INDEPENDENT_AMBULATORY_CARE_PROVIDER_SITE_OTHER): Payer: Self-pay | Admitting: *Deleted
